# Patient Record
Sex: Male | Born: 1995 | Race: White | Hispanic: No | Marital: Single | Smoking: Never smoker
Health system: Southern US, Community
[De-identification: ages and names within clinical notes are randomized; demographics above are authoritative.]

## PROBLEM LIST (undated history)

## (undated) DIAGNOSIS — J45909 Unspecified asthma, uncomplicated: Secondary | ICD-10-CM

## (undated) DIAGNOSIS — F32A Depression, unspecified: Secondary | ICD-10-CM

## (undated) DIAGNOSIS — F419 Anxiety disorder, unspecified: Secondary | ICD-10-CM

## (undated) DIAGNOSIS — D649 Anemia, unspecified: Secondary | ICD-10-CM

## (undated) DIAGNOSIS — N200 Calculus of kidney: Secondary | ICD-10-CM

## (undated) DIAGNOSIS — Z87442 Personal history of urinary calculi: Secondary | ICD-10-CM

## (undated) DIAGNOSIS — T7840XA Allergy, unspecified, initial encounter: Secondary | ICD-10-CM

## (undated) DIAGNOSIS — K219 Gastro-esophageal reflux disease without esophagitis: Secondary | ICD-10-CM

## (undated) DIAGNOSIS — K5190 Ulcerative colitis, unspecified, without complications: Secondary | ICD-10-CM

## (undated) HISTORY — DX: Anemia, unspecified: D64.9

## (undated) HISTORY — DX: Anxiety disorder, unspecified: F41.9

## (undated) HISTORY — DX: Allergy, unspecified, initial encounter: T78.40XA

## (undated) HISTORY — PX: TONSILLECTOMY: SUR1361

## (undated) HISTORY — PX: COLONOSCOPY: SHX174

## (undated) HISTORY — DX: Depression, unspecified: F32.A

## (undated) HISTORY — DX: Gastro-esophageal reflux disease without esophagitis: K21.9

---
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
## 2019-01-05 ENCOUNTER — Other Ambulatory Visit: Payer: Self-pay

## 2019-01-05 DIAGNOSIS — Z20822 Contact with and (suspected) exposure to covid-19: Secondary | ICD-10-CM

## 2019-01-06 LAB — NOVEL CORONAVIRUS, NAA: SARS-CoV-2, NAA: NOT DETECTED

## 2019-03-11 ENCOUNTER — Encounter (HOSPITAL_COMMUNITY): Payer: Self-pay

## 2019-03-11 ENCOUNTER — Other Ambulatory Visit: Payer: Self-pay

## 2019-03-11 ENCOUNTER — Emergency Department (HOSPITAL_COMMUNITY)
Admission: EM | Admit: 2019-03-11 | Discharge: 2019-03-11 | Disposition: A | Discharge status: Home / Self Care | Payer: Self-pay | Attending: Emergency Medicine | Admitting: Emergency Medicine

## 2019-03-11 ENCOUNTER — Emergency Department (HOSPITAL_COMMUNITY): Payer: Self-pay

## 2019-03-11 DIAGNOSIS — Z79899 Other long term (current) drug therapy: Secondary | ICD-10-CM | POA: Insufficient documentation

## 2019-03-11 DIAGNOSIS — K921 Melena: Secondary | ICD-10-CM | POA: Insufficient documentation

## 2019-03-11 DIAGNOSIS — J45909 Unspecified asthma, uncomplicated: Secondary | ICD-10-CM | POA: Insufficient documentation

## 2019-03-11 DIAGNOSIS — R1030 Lower abdominal pain, unspecified: Secondary | ICD-10-CM | POA: Insufficient documentation

## 2019-03-11 DIAGNOSIS — D649 Anemia, unspecified: Secondary | ICD-10-CM | POA: Insufficient documentation

## 2019-03-11 DIAGNOSIS — R935 Abnormal findings on diagnostic imaging of other abdominal regions, including retroperitoneum: Secondary | ICD-10-CM | POA: Insufficient documentation

## 2019-03-11 DIAGNOSIS — R21 Rash and other nonspecific skin eruption: Secondary | ICD-10-CM | POA: Insufficient documentation

## 2019-03-11 LAB — CBC WITH DIFFERENTIAL/PLATELET
Abs Immature Granulocytes: 0.04 10*3/uL (ref 0.00–0.07)
Basophils Absolute: 0.1 10*3/uL (ref 0.0–0.1)
Basophils Relative: 1 %
Eosinophils Absolute: 0.7 10*3/uL — ABNORMAL HIGH (ref 0.0–0.5)
Eosinophils Relative: 5 %
HCT: 27.1 % — ABNORMAL LOW (ref 39.0–52.0)
Hemoglobin: 7.5 g/dL — ABNORMAL LOW (ref 13.0–17.0)
Immature Granulocytes: 0 %
Lymphocytes Relative: 13 %
Lymphs Abs: 2 10*3/uL (ref 0.7–4.0)
MCH: 19.6 pg — ABNORMAL LOW (ref 26.0–34.0)
MCHC: 27.7 g/dL — ABNORMAL LOW (ref 30.0–36.0)
MCV: 70.9 fL — ABNORMAL LOW (ref 80.0–100.0)
Monocytes Absolute: 1 10*3/uL (ref 0.1–1.0)
Monocytes Relative: 6 %
Neutro Abs: 11.2 10*3/uL — ABNORMAL HIGH (ref 1.7–7.7)
Neutrophils Relative %: 75 %
Platelets: 588 10*3/uL — ABNORMAL HIGH (ref 150–400)
RBC: 3.82 MIL/uL — ABNORMAL LOW (ref 4.22–5.81)
RDW: 15.8 % — ABNORMAL HIGH (ref 11.5–15.5)
WBC: 15 10*3/uL — ABNORMAL HIGH (ref 4.0–10.5)
nRBC: 0 % (ref 0.0–0.2)

## 2019-03-11 LAB — FOLATE: Folate: 11.4 ng/mL (ref >5.9)

## 2019-03-11 LAB — IRON AND TIBC
Iron: 10 ug/dL — ABNORMAL LOW (ref 45–182)
Saturation Ratios: 2 % — ABNORMAL LOW (ref 17.9–39.5)
TIBC: 558 ug/dL — ABNORMAL HIGH (ref 250–450)
UIBC: 548 ug/dL

## 2019-03-11 LAB — POC OCCULT BLOOD, ED: Fecal Occult Bld: NEGATIVE

## 2019-03-11 LAB — COMPREHENSIVE METABOLIC PANEL
ALT: 33 U/L (ref 0–44)
AST: 24 U/L (ref 15–41)
Albumin: 3.7 g/dL (ref 3.5–5.0)
Alkaline Phosphatase: 92 U/L (ref 38–126)
Anion gap: 10 (ref 5–15)
BUN: 12 mg/dL (ref 6–20)
CO2: 24 mmol/L (ref 22–32)
Calcium: 9 mg/dL (ref 8.9–10.3)
Chloride: 102 mmol/L (ref 98–111)
Creatinine, Ser: 0.95 mg/dL (ref 0.61–1.24)
GFR calc Af Amer: >60 mL/min (ref >60)
GFR calc non Af Amer: >60 mL/min (ref >60)
Glucose, Bld: 91 mg/dL (ref 70–99)
Potassium: 4.1 mmol/L (ref 3.5–5.1)
Sodium: 136 mmol/L (ref 135–145)
Total Bilirubin: 0.4 mg/dL (ref 0.3–1.2)
Total Protein: 8 g/dL (ref 6.5–8.1)

## 2019-03-11 LAB — RETICULOCYTES
Immature Retic Fract: 34.1 % — ABNORMAL HIGH (ref 2.3–15.9)
RBC.: 3.82 MIL/uL — ABNORMAL LOW (ref 4.22–5.81)
Retic Count, Absolute: 83.7 10*3/uL (ref 19.0–186.0)
Retic Ct Pct: 2.2 % (ref 0.4–3.1)

## 2019-03-11 LAB — TYPE AND SCREEN
ABO/RH(D): O POS
Antibody Screen: NEGATIVE

## 2019-03-11 LAB — FERRITIN: Ferritin: 9 ng/mL — ABNORMAL LOW (ref 24–336)

## 2019-03-11 LAB — SEDIMENTATION RATE: Sed Rate: 35 mm/hr — ABNORMAL HIGH (ref 0–16)

## 2019-03-11 LAB — VITAMIN B12: Vitamin B-12: 200 pg/mL (ref 180–914)

## 2019-03-11 MED ORDER — IOHEXOL 350 MG/ML SOLN
100.0000 mL | Freq: Once | INTRAVENOUS | Status: AC | PRN
  Administered 2019-03-11: 100 mL via INTRAVENOUS

## 2019-03-11 MED ORDER — FERROUS SULFATE 325 (65 FE) MG PO TABS: 325.0000 mg | ORAL_TABLET | Freq: Every day | ORAL | 0 refills | Status: DC

## 2019-03-11 NOTE — Discharge Instructions (Signed)
Please follow-up with the hematologist as well as the gastroenterologist provided.  They have been briefed regarding your evaluation and work-up and will be expecting your follow-up.  Please take your iron supplements, as prescribed.  Please return to the ED or seek medical attention immediately for any new or worsening symptoms.

## 2019-03-11 NOTE — ED Triage Notes (Signed)
Pt sent from [facility].  NP says he saw pt yesterday.  Reports pt has lesions around waste and groin, blood in stool, and  pain around umbilicus.  Reports hgb 7.9 and wbc 14.  Pt says has been having these symptoms for the past 10 months or more.

## 2019-03-11 NOTE — ED Provider Notes (Addendum)
[facility] EMERGENCY DEPARTMENT Provider Note   CSN: [id number] Arrival date & time: 03/11/19  4827     History Chief Complaint  Patient presents with  . GI Bleeding    Gregory Clarke is a 23 y.o. male with PMH significant for kidney stones and asthma who was sent from the ED from [facility] after patient reported a 26-monthhistory of loose bloody stools and groin lesions and was found to have hemoglobin of 7.9 and white blood cell count of 14.  Patient reports that he notices BRBPR intermittently with defecation.  He reports that he has been feeling progressively more fatigued and low energy these past couple months and has been seeing increasingly bloody bowel movements.  He also endorses lesions in the groin area that became as firm lumps before subsequently eroding and leaving scarring.  He is sexually active endorses unprotected sex, with his long-term girlfriend.  He denies any fevers or chills, chest pain, nausea or vomiting, headache or dizziness, hematuria, history of hemorrhoids, penile discharge, testicular swelling or discomfort, recent illness, or any other symptoms.  He reports that his mother's fianc is a family medicine provider, but that he did not want to see anyone despite chronicity of illness due to insurance concerns.  HPI     Past Medical History:  Diagnosis Date  . Asthma   . Kidney stones     Patient Active Problem List   Diagnosis Date Noted  . Foot fracture 09/09/2014    Past Surgical History:  Procedure Laterality Date  . TONSILLECTOMY         No family history on file.  Social History   Tobacco Use  . Smoking status: Never Smoker  . Smokeless tobacco: Never Used  Substance Use Topics  . Alcohol use: No  . Drug use: Yes    Types: Marijuana    Home Medications Prior to Admission medications   Medication Sig Start Date End Date Taking? Authorizing Provider  albuterol (VENTOLIN HFA) 108 (90 Base) MCG/ACT inhaler  Inhale 2 puffs into the lungs every 6 (six) hours as needed. 02/27/19  Yes [provider]  cetirizine (ZYRTEC) 10 MG tablet Take 10 mg by mouth daily.   Yes [provider]  tamsulosin (FLOMAX) 0.4 MG CAPS capsule Take 1 capsule (0.4 mg total) by mouth daily. Patient taking differently: Take 0.4 mg by mouth daily as needed.  12/07/16  Yes Triplett, Tammy, PA-C  ferrous sulfate 325 (65 FE) MG tablet Take 1 tablet (325 mg total) by mouth daily. 03/11/19   GCorena Herter PA-C    Allergies    Patient has no known allergies.  Review of Systems   Review of Systems  Constitutional: Negative for chills and fever.  Gastrointestinal: Positive for abdominal pain and blood in stool.  Genitourinary: Positive for genital sores. Negative for discharge and testicular pain.  Skin: Positive for rash.    Physical Exam Updated Vital Signs BP 104/86   Pulse 96   Temp 98.4 F (36.9 C) (Oral)   Resp 20   Ht 6' 2"  (1.88 m)   Wt 111.1 kg   SpO2 99%   BMI 31.46 kg/m   Physical Exam Vitals and nursing note reviewed. Exam conducted with a chaperone present.  Constitutional:      Appearance: Normal appearance.  HENT:     Head: Normocephalic and atraumatic.  Eyes:     General: No scleral icterus.    Conjunctiva/sclera: Conjunctivae normal.  Cardiovascular:  Rate and Rhythm: Normal rate and regular rhythm.     Pulses: Normal pulses.     Heart sounds: Normal heart sounds.  Pulmonary:     Effort: Pulmonary effort is normal. No respiratory distress.     Breath sounds: Normal breath sounds. No wheezing or rales.  Abdominal:     Comments: Soft, nondistended.  No significant TTP.  Mild TTP over suprapubic region.  No overlying skin changes.  No guarding.  Genitourinary:    Comments: No fissures or hemorrhoids noted on physical exam.  No BRBPR.  No melena.  No masses appreciated.  No testicular swelling or tenderness.  No penile lesions.  Diffuse rash in the area of  groin. Musculoskeletal:     Cervical back: Normal range of motion and neck supple. No rigidity.  Skin:    General: Skin is dry.  Neurological:     Mental Status: He is alert and oriented to person, place, and time.     GCS: GCS eye subscore is 4. GCS verbal subscore is 5. GCS motor subscore is 6.  Psychiatric:        Mood and Affect: Mood normal.        Behavior: Behavior normal.        Thought Content: Thought content normal.           ED Results / Procedures / Treatments   Labs (all labs ordered are listed, but only abnormal results are displayed) Labs Reviewed  CBC WITH DIFFERENTIAL/PLATELET - Abnormal; Notable for the following components:      Result Value   WBC 15.0 (*)    RBC 3.82 (*)    Hemoglobin 7.5 (*)    HCT 27.1 (*)    MCV 70.9 (*)    MCH 19.6 (*)    MCHC 27.7 (*)    RDW 15.8 (*)    Platelets 588 (*)    Neutro Abs 11.2 (*)    Eosinophils Absolute 0.7 (*)    All other components within normal limits  SEDIMENTATION RATE - Abnormal; Notable for the following components:   Sed Rate 35 (*)    All other components within normal limits  COMPREHENSIVE METABOLIC PANEL  URINALYSIS, ROUTINE W REFLEX MICROSCOPIC  VITAMIN B12  FOLATE  IRON AND TIBC  FERRITIN  RETICULOCYTES  POC OCCULT BLOOD, ED  TYPE AND SCREEN    EKG None  Radiology CT Angio Abd/Pel W and/or Wo Contrast  Result Date: 03/11/2019 CLINICAL DATA:  23 year old male with a history of blood in the stool and periumbilical pain EXAM: CTA ABDOMEN AND PELVIS WITHOUT AND WITH CONTRAST TECHNIQUE: Multidetector CT imaging of the abdomen and pelvis was performed using the standard protocol during bolus administration of intravenous contrast. Multiplanar reconstructed images and MIPs were obtained and reviewed to evaluate the vascular anatomy. CONTRAST:  174m OMNIPAQUE IOHEXOL 350 MG/ML SOLN COMPARISON:  None. FINDINGS: VASCULAR Aorta: Unremarkable course, caliber, contour of the abdominal aorta. No  dissection, aneurysm, or periaortic fluid. Celiac: Patent, with no significant atherosclerotic changes. SMA: Patent, with no significant atherosclerotic changes. Renals: Renal arteries are patent. IMA: Inferior mesenteric artery is patent. Right lower extremity: Unremarkable course, caliber, and contour of the right iliac system. No aneurysm, dissection, or occlusion. Hypogastric artery is patent. Common femoral artery patent. Proximal SFA and profunda femoris patent. Left lower extremity: Unremarkable course, caliber, and contour of the left iliac system. No aneurysm, dissection, or occlusion. Hypogastric artery is patent. Common femoral artery patent. Proximal SFA and profunda femoris patent. Veins:  Unremarkable appearance of the venous system. Review of the MIP images confirms the above findings. NON-VASCULAR Lower chest: No acute. Hepatobiliary: Unremarkable appearance of the liver. Unremarkable gall bladder. Pancreas: Unremarkable. Spleen: Unremarkable. Adrenals/Urinary Tract: Unremarkable appearance of adrenal glands. Right: No hydronephrosis. Symmetric perfusion to the left. Interval resolution of mild hydronephrosis when compared to the prior CT. There has been interval passage of the previous distal right ureteral stone. No inflammatory changes. Left: No hydronephrosis. Symmetric perfusion to the right. Nonobstructive nephrolithiasis of the left collecting system with single stone in the lateral collecting system. There appears to have been interval passage of the inferior collecting system stone. Unremarkable left ureter. Unremarkable appearance of the urinary bladder . Stomach/Bowel: Unremarkable appearance of the stomach. Unremarkable appearance of small bowel. No evidence of obstruction. Normal appendix. Unremarkable appearance of the colon. Lymphatic: Compared to the prior CT, there is a similar distribution and number of lymph nodes throughout the mesentery of both small bowel and colon. The most  prominent lymph nodes are near the splenic flexure, and within the mid abdomen, having increased in size in the interval. Largest measured node in the left upper quadrant measures 12 mm on image 18 of series 4. There appear to be nodes within the mesentery of the colon from the cecum through the sigmoid colon. In addition there are nodes in the perirectal fat and within the lateral pelvis such as the left-sided node on image 79 of series 4, also enlarging in the interval. Mesenteric: No free air or inflammatory changes. Reproductive: Unremarkable appearance of the pelvic organs. Other: No hernia. Musculoskeletal: No evidence of acute fracture. No bony canal narrowing. No significant degenerative changes of the hips. IMPRESSION: No acute arterial abnormality, with no CT evidence gastrointestinal hemorrhage. The CT demonstrates mesenteric adenopathy, with nodal pattern that is suspicious by both number and size, as there has been interval enlargement since the prior CT scan 12/07/2016. The differential includes myeloproliferative disorder/lymphoma, and referral for hematologic evaluation is recommended. Interval resolution of the right ureteral stone on prior CT, with persisting left-sided nonobstructive nephrolithiasis. Electronically Signed   By: Corrie Mckusick D.O.   On: 03/11/2019 16:34    Procedures Procedures (including critical care time)  Medications Ordered in ED Medications  iohexol (OMNIPAQUE) 350 MG/ML injection 100 mL (100 mLs Intravenous Contrast Given 03/11/19 1540)    ED Course  I have reviewed the triage vital signs and the nursing notes.  Pertinent labs & imaging results that were available during my care of the patient were reviewed by me and considered in my medical decision making (see chart for details).    MDM Rules/Calculators/A&P                      Given his history of illness, involving 28-monthhistory of loose stools with intermittent hematochezia, suspect possible  inflammatory bowel disease.  Patient's report of his groin rash involving firm nodules that then subsequently erode and scar suspicious for erythema nodosum, an extra gastrointestinal manifestation of inflammatory bowel disease.  Will obtain CT imaging of abdomen and pelvis with contrast to assess for abscess, toxic megacolon, obstruction, or other possible complications.  While patient has a leukocytosis to 15.0 and is anemic with hemoglobin 7.5, his CMP is reassuring demonstrates no electrolyte abnormalities.  Patient is in no acute distress and is hemodynamically stable.  My physical exam was reassuring and there is no BRBPR or evidence of melena, hemorrhoids, or fissures and fecal occult obtained was negative for  acute bleed.  Patient denies any nausea and is tolerating food and liquid without difficulty. Sed rate was also obtained and came back elevated to 35, further evidence to support inflammatory bowel disease.    CT abdomen and pelvis obtained with contrast demonstrates mesenteric adenopathy with nodal pattern suspicious for myeloproliferative disorder or lymphoma.  We will consult with gastroenterology as well as hematology.  Given his normal vital signs and chronicity of illness, discussed with Dr. Rogene Houston and we feel patient is appropriate for discharge with outpatient gastroenterology and hematology follow-up.    Dr. Tera Helper consulted and will see patient in office for ongoing evaluation and management.  Agrees that this is more likely an IBD picture, but will at least correct iron deficiency anemia with infusions.  Gastroenterology was consulted and patient requires iron studies and B12, but will see patient in 1 week for an appointment.  He will also require a colonoscopy.  Strict return precautions discussed with patient.  All of the evaluation and work-up results were discussed with the patient and any family at bedside. They were provided opportunity to ask any additional questions and  have none at this time. They have expressed understanding of verbal discharge instructions as well as return precautions and are agreeable to the plan.    Final Clinical Impression(s) / ED Diagnoses Final diagnoses:  Anemia, unspecified type    Rx / DC Orders ED Discharge Orders         Ordered    ferrous sulfate 325 (65 FE) MG tablet  Daily     03/11/19 1718           Corena Herter, PA-C 03/11/19 1718    Corena Herter, PA-C 03/11/19 1718    Noemi Chapel, MD 03/11/19 [phone]

## 2019-03-11 NOTE — ED Provider Notes (Signed)
This patient is a 23 year old male, presents pale in the conjunctive in the nailbeds, has had some symptoms over time including some hematochezia and was found today to be anemic with a low hemoglobin.  On exam the patient has a minimally tender abdomen, he is well-appearing with normal vital signs, no edema but pale as described above.  I have reviewed all of his abnormal labs with him, I personally spoke with Dr. Laural Golden of the gastroenterology service will follow up with him for colonoscopy next week and oncology was consulted and made recommendations as well.  The patient is aware that his CT scan and labs suggest the need for close follow-up, he is agreeable to this, he is also aware that this may be cancer and he will need to follow-up with the oncologist for that reason.  At this time the patient is stable for discharge and answered all questions.  Medical screening examination/treatment/procedure(s) were conducted as a shared visit with non-physician practitioner(s) and myself.  I personally evaluated the patient during the encounter.  Clinical Impression:   Final diagnoses:  Anemia, unspecified type  Abnormal CT of the abdomen         Noemi Chapel, MD 03/11/19 1750

## 2019-03-11 NOTE — ED Notes (Signed)
Pt given urinal.

## 2019-03-16 ENCOUNTER — Encounter (INDEPENDENT_AMBULATORY_CARE_PROVIDER_SITE_OTHER): Payer: Self-pay | Admitting: Internal Medicine

## 2019-03-16 ENCOUNTER — Other Ambulatory Visit (INDEPENDENT_AMBULATORY_CARE_PROVIDER_SITE_OTHER): Payer: Self-pay | Admitting: *Deleted

## 2019-03-16 ENCOUNTER — Other Ambulatory Visit: Payer: Self-pay

## 2019-03-16 ENCOUNTER — Ambulatory Visit (INDEPENDENT_AMBULATORY_CARE_PROVIDER_SITE_OTHER): Payer: Self-pay | Admitting: Internal Medicine

## 2019-03-16 DIAGNOSIS — R197 Diarrhea, unspecified: Secondary | ICD-10-CM

## 2019-03-16 DIAGNOSIS — D500 Iron deficiency anemia secondary to blood loss (chronic): Secondary | ICD-10-CM

## 2019-03-16 DIAGNOSIS — D509 Iron deficiency anemia, unspecified: Secondary | ICD-10-CM | POA: Insufficient documentation

## 2019-03-16 MED ORDER — DICYCLOMINE HCL 10 MG PO CAPS: 10.0000 mg | ORAL_CAPSULE | Freq: Three times a day (TID) | ORAL | 1 refills | Status: DC | PRN

## 2019-03-16 NOTE — Patient Instructions (Addendum)
Colonoscopy to be scheduled. Please stop iron at least 5 days before colonoscopy. Will check CBC at the time of colonoscopy. You can Tylenol up to 2 g/day on as-needed basis but no OTC NSAIDs as discussed.

## 2019-03-16 NOTE — Progress Notes (Signed)
Presenting complaint  Bloody diarrhea and iron deficiency anemia.  History of present illness  Patient is 24 year old who is referred through courtesy of Dr. Noemi Chapel of [facility] emergency room where he was seen on 03/11/2019.  Patient reports 1 year history of rectal bleeding.  To begin with he would notice small amount of blood with his bowel movements and it would occur every now and then.  Stool consistency change about 6 months ago when he began to have diarrhea.  On his worst days he would have as many as 4-5 stools per day.  With change in stool consistency he also noted increased amount of blood that he would pass with his bowel movements.  He also has been experiencing frequent nocturnal bowel movement but has not had any accidents.  For the last 1 month he has been experiencing sharp intermittent mid abdominal pain.  This pain is usually followed by a bowel movement but pain is not always relieved.  He has maintained his appetite despite the symptoms and he has not lost any weight.  He states he started probiotic 4 days ago and feels his stool is firming up.  However he is passing blood like before. Patient states he was seen by Rosealee Albee, PA at [facility] on 03/11/2019.  He had blood work.  His hemoglobin was low and he was advised to come to emergency room. Evaluation in emergency room revealed him to have H&H of 7.5 and 27.1 and MCV was low at 70.9.  Comprehensive chemistry panel was normal.  B12 and folate levels were normal but serum iron iron saturation and ferritin levels were normal.  His stool was noted to be heme positive. While in emergency room he also had abdominal pelvic CT which revealed small stone in left kidney but no abnormality noted to small or large bowel. I was contacted by Dr. Sabra Heck to arrange visit as soon as possible.  He felt patient was stable for outpatient work-up.  He was begun on iron pill which she has taken every day. He noted a rash to his  left elbow about 2 months ago.  He had been using topical hydrocortisone and rash resolved but rash came back 2 couple of days ago. Patient rarely takes Excedrin for headache.  He usually takes Tylenol.  He denies nausea vomiting heartburn fever chills or night sweats. Review of the systems is positive for history of kidney stones.  He states he has passed 8 to 10 stones in the last few years.  Stones have been analyzed but he does not remember the results.   Current Medications: Outpatient Encounter Medications as of 03/16/2019  Medication Sig  . albuterol (VENTOLIN HFA) 108 (90 Base) MCG/ACT inhaler Inhale 2 puffs into the lungs every 6 (six) hours as needed.  . ferrous sulfate 325 (65 FE) MG tablet Take 1 tablet (325 mg total) by mouth daily.  . tamsulosin (FLOMAX) 0.4 MG CAPS capsule Take 1 capsule (0.4 mg total) by mouth daily. (Patient taking differently: Take 0.4 mg by mouth daily as needed. )  . [DISCONTINUED] cetirizine (ZYRTEC) 10 MG tablet Take 10 mg by mouth daily.   No facility-administered encounter medications on file as of 03/16/2019.   Past medical history  History of kidney stones.  He has never required any intervention.  He has passed multiple stones over the last few years. Tonsillectomy at age 34. Mild obesity with BMI of 31.24% History of bronchial asthma.  He is not taking any  medications at the present time  Allergies  NKA  Family history  Both parents are in their late 54s and in good health. He has 1 biologic brother in good health. He has 2 half brothers and one half sister also in good health.   Social history  Patient is single.  He is never married.  He graduated from high school in 2017.  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  He is presently unemployed.  He does not smoke cigarettes or drink alcohol.  Physical examination  Blood pressure  (!) 148/84, pulse (!) 116, temperature (!) 97.2 F (36.2 C), temperature source Oral, height 6' 2"  (1.88 m), weight 243 lb 4.8 oz (110.4 kg). Patient is alert and in no acute distress. Conjunctiva is pale. Sclera is nonicteric Oropharyngeal mucosa is normal. No neck masses or thyromegaly noted. Cardiac exam with regular rhythm normal S1 and S2. No murmur or gallop noted. Lungs are clear to auscultation. Abdomen is symmetrical.  Bowel sounds are normal.  On palpation abdomen is soft.  He has mild periumbilical tenderness.  No organomegaly or masses. Rectal examination deferred. No LE edema or clubbing noted. He has multiple small tattoos around his left and right wrists. He has maculopapular erythematous rash involving ulnar aspect of left elbow and proximal forearm.  Labs/studies Results:  CBC Latest Ref Rng & Units 03/11/2019  WBC 4.0 - 10.5 K/uL 15.0(H)  Hemoglobin 13.0 - 17.0 g/dL 7.5(L)  Hematocrit 39.0 - 52.0 % 27.1(L)  Platelets 150 - 400 K/uL 588(H)    CMP Latest Ref Rng & Units 03/11/2019  Glucose 70 - 99 mg/dL 91  BUN 6 - 20 mg/dL 12  Creatinine 0.61 - 1.24 mg/dL 0.95  Sodium 135 - 145 mmol/L 136  Potassium 3.5 - 5.1 mmol/L 4.1  Chloride 98 - 111 mmol/L 102  CO2 22 - 32 mmol/L 24  Calcium 8.9 - 10.3 mg/dL 9.0  Total Protein 6.5 - 8.1 g/dL 8.0  Total Bilirubin 0.3 - 1.2 mg/dL 0.4  Alkaline Phos 38 - 126 U/L 92  AST 15 - 41 U/L 24  ALT 0 - 44 U/L 33    Hepatic Function Latest Ref Rng & Units 03/11/2019  Total Protein 6.5 - 8.1 g/dL 8.0  Albumin 3.5 - 5.0 g/dL 3.7  AST 15 - 41 U/L 24  ALT 0 - 44 U/L 33  Alk Phosphatase 38 - 126 U/L 92  Total Bilirubin 0.3 - 1.2 mg/dL 0.4     Sed rate 35 Serum folate 11.4 Serum B12 200 Serum iron 10, TIBC 558 and saturation 2% Serum ferritin 9.  CTA abdomen and pelvis from 03/11/2019 reviewed. No wall thickening noted to small or large bowel.  There is mesenteric adenopathy more prominent since prior study of 12/07/2016.   Small stone in left kidney.  Assessment:  Patient is 24 year old Caucasian male who presents with bloody diarrhea and iron deficiency anemia.  His symptoms began 1 year ago with sporadic hematochezia followed by diarrhea about 6 months ago and he is now having mid abdominal pain.  Recent CT showed mesenteric adenopathy but no wall thickening to small or large bowel.  Patient's presentation is very worrisome for inflammatory bowel disease.  Colonic lymphoma is also in differential diagnosis but he does not have any constitutional symptoms.  He would benefit from diagnostic colonoscopy as soon as possible.  Recommendations  Dicyclomine  10 mg by mouth 3 times a day as needed for abdominal pain. Diagnostic colonoscopy under monitored anesthesia care as soon as possible. Patient advised to hold p.o. iron for at least 5 to 7 days prior to the procedure.

## 2019-03-16 NOTE — H&P (View-Only) (Signed)
Presenting complaint  Bloody diarrhea and iron deficiency anemia.  History of present illness  Patient is 24 year old who is referred through courtesy of Dr. Noemi Chapel of [facility] emergency room where he was seen on 03/11/2019.  Patient reports 1 year history of rectal bleeding.  To begin with he would notice small amount of blood with his bowel movements and it would occur every now and then.  Stool consistency change about 6 months ago when he began to have diarrhea.  On his worst days he would have as many as 4-5 stools per day.  With change in stool consistency he also noted increased amount of blood that he would pass with his bowel movements.  He also has been experiencing frequent nocturnal bowel movement but has not had any accidents.  For the last 1 month he has been experiencing sharp intermittent mid abdominal pain.  This pain is usually followed by a bowel movement but pain is not always relieved.  He has maintained his appetite despite the symptoms and he has not lost any weight.  He states he started probiotic 4 days ago and feels his stool is firming up.  However he is passing blood like before. Patient states he was seen by Rosealee Albee, PA at [facility] on 03/11/2019.  He had blood work.  His hemoglobin was low and he was advised to come to emergency room. Evaluation in emergency room revealed him to have H&H of 7.5 and 27.1 and MCV was low at 70.9.  Comprehensive chemistry panel was normal.  B12 and folate levels were normal but serum iron iron saturation and ferritin levels were normal.  His stool was noted to be heme positive. While in emergency room he also had abdominal pelvic CT which revealed small stone in left kidney but no abnormality noted to small or large bowel. I was contacted by Dr. Sabra Heck to arrange visit as soon as possible.  He felt patient was stable for outpatient work-up.  He was begun on iron pill which she has taken every day. He noted a rash to his  left elbow about 2 months ago.  He had been using topical hydrocortisone and rash resolved but rash came back 2 couple of days ago. Patient rarely takes Excedrin for headache.  He usually takes Tylenol.  He denies nausea vomiting heartburn fever chills or night sweats. Review of the systems is positive for history of kidney stones.  He states he has passed 8 to 10 stones in the last few years.  Stones have been analyzed but he does not remember the results.   Current Medications: Outpatient Encounter Medications as of 03/16/2019  Medication Sig  . albuterol (VENTOLIN HFA) 108 (90 Base) MCG/ACT inhaler Inhale 2 puffs into the lungs every 6 (six) hours as needed.  . ferrous sulfate 325 (65 FE) MG tablet Take 1 tablet (325 mg total) by mouth daily.  . tamsulosin (FLOMAX) 0.4 MG CAPS capsule Take 1 capsule (0.4 mg total) by mouth daily. (Patient taking differently: Take 0.4 mg by mouth daily as needed. )  . [DISCONTINUED] cetirizine (ZYRTEC) 10 MG tablet Take 10 mg by mouth daily.   No facility-administered encounter medications on file as of 03/16/2019.   Past medical history  History of kidney stones.  He has never required any intervention.  He has passed multiple stones over the last few years. Tonsillectomy at age 65. Mild obesity with BMI of 31.24% History of bronchial asthma.  He is not taking any  medications at the present time  Allergies  NKA  Family history  Both parents are in their late 36s and in good health. He has 1 biologic brother in good health. He has 2 half brothers and one half sister also in good health.   Social history  Patient is single.  He is never married.  He graduated from high school in 2017.  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  He is presently unemployed.  He does not smoke cigarettes or drink alcohol.  Physical examination  Blood pressure  (!) 148/84, pulse (!) 116, temperature (!) 97.2 F (36.2 C), temperature source Oral, height 6' 2"  (1.88 m), weight 243 lb 4.8 oz (110.4 kg). Patient is alert and in no acute distress. Conjunctiva is pale. Sclera is nonicteric Oropharyngeal mucosa is normal. No neck masses or thyromegaly noted. Cardiac exam with regular rhythm normal S1 and S2. No murmur or gallop noted. Lungs are clear to auscultation. Abdomen is symmetrical.  Bowel sounds are normal.  On palpation abdomen is soft.  He has mild periumbilical tenderness.  No organomegaly or masses. Rectal examination deferred. No LE edema or clubbing noted. He has multiple small tattoos around his left and right wrists. He has maculopapular erythematous rash involving ulnar aspect of left elbow and proximal forearm.  Labs/studies Results:  CBC Latest Ref Rng & Units 03/11/2019  WBC 4.0 - 10.5 K/uL 15.0(H)  Hemoglobin 13.0 - 17.0 g/dL 7.5(L)  Hematocrit 39.0 - 52.0 % 27.1(L)  Platelets 150 - 400 K/uL 588(H)    CMP Latest Ref Rng & Units 03/11/2019  Glucose 70 - 99 mg/dL 91  BUN 6 - 20 mg/dL 12  Creatinine 0.61 - 1.24 mg/dL 0.95  Sodium 135 - 145 mmol/L 136  Potassium 3.5 - 5.1 mmol/L 4.1  Chloride 98 - 111 mmol/L 102  CO2 22 - 32 mmol/L 24  Calcium 8.9 - 10.3 mg/dL 9.0  Total Protein 6.5 - 8.1 g/dL 8.0  Total Bilirubin 0.3 - 1.2 mg/dL 0.4  Alkaline Phos 38 - 126 U/L 92  AST 15 - 41 U/L 24  ALT 0 - 44 U/L 33    Hepatic Function Latest Ref Rng & Units 03/11/2019  Total Protein 6.5 - 8.1 g/dL 8.0  Albumin 3.5 - 5.0 g/dL 3.7  AST 15 - 41 U/L 24  ALT 0 - 44 U/L 33  Alk Phosphatase 38 - 126 U/L 92  Total Bilirubin 0.3 - 1.2 mg/dL 0.4     Sed rate 35 Serum folate 11.4 Serum B12 200 Serum iron 10, TIBC 558 and saturation 2% Serum ferritin 9.  CTA abdomen and pelvis from 03/11/2019 reviewed. No wall thickening noted to small or large bowel.  There is mesenteric adenopathy more prominent since prior study of 12/07/2016.   Small stone in left kidney.  Assessment:  Patient is 24 year old Caucasian male who presents with bloody diarrhea and iron deficiency anemia.  His symptoms began 1 year ago with sporadic hematochezia followed by diarrhea about 6 months ago and he is now having mid abdominal pain.  Recent CT showed mesenteric adenopathy but no wall thickening to small or large bowel.  Patient's presentation is very worrisome for inflammatory bowel disease.  Colonic lymphoma is also in differential diagnosis but he does not have any constitutional symptoms.  He would benefit from diagnostic colonoscopy as soon as possible.  Recommendations  Dicyclomine  10 mg by mouth 3 times a day as needed for abdominal pain. Diagnostic colonoscopy under monitored anesthesia care as soon as possible. Patient advised to hold p.o. iron for at least 5 to 7 days prior to the procedure.

## 2019-03-17 ENCOUNTER — Other Ambulatory Visit (INDEPENDENT_AMBULATORY_CARE_PROVIDER_SITE_OTHER): Payer: Self-pay | Admitting: *Deleted

## 2019-03-17 ENCOUNTER — Encounter (INDEPENDENT_AMBULATORY_CARE_PROVIDER_SITE_OTHER): Payer: Self-pay | Admitting: *Deleted

## 2019-03-17 ENCOUNTER — Encounter (INDEPENDENT_AMBULATORY_CARE_PROVIDER_SITE_OTHER): Payer: Self-pay

## 2019-03-17 DIAGNOSIS — D500 Iron deficiency anemia secondary to blood loss (chronic): Secondary | ICD-10-CM | POA: Insufficient documentation

## 2019-03-19 ENCOUNTER — Encounter (HOSPITAL_COMMUNITY): Payer: Self-pay | Admitting: Hematology

## 2019-03-19 ENCOUNTER — Other Ambulatory Visit: Payer: Self-pay

## 2019-03-19 ENCOUNTER — Inpatient Hospital Stay (HOSPITAL_COMMUNITY): Payer: Self-pay | Attending: Hematology | Admitting: Hematology

## 2019-03-19 ENCOUNTER — Inpatient Hospital Stay (HOSPITAL_COMMUNITY): Payer: Self-pay

## 2019-03-19 ENCOUNTER — Encounter (HOSPITAL_COMMUNITY): Payer: Self-pay | Admitting: *Deleted

## 2019-03-19 VITALS — BP 119/72 | HR 113 | Temp 97.8°F | Resp 18 | Ht 73.5 in | Wt 243.8 lb

## 2019-03-19 DIAGNOSIS — R591 Generalized enlarged lymph nodes: Secondary | ICD-10-CM | POA: Insufficient documentation

## 2019-03-19 DIAGNOSIS — R197 Diarrhea, unspecified: Secondary | ICD-10-CM

## 2019-03-19 DIAGNOSIS — R109 Unspecified abdominal pain: Secondary | ICD-10-CM | POA: Insufficient documentation

## 2019-03-19 DIAGNOSIS — R590 Localized enlarged lymph nodes: Secondary | ICD-10-CM | POA: Insufficient documentation

## 2019-03-19 DIAGNOSIS — N200 Calculus of kidney: Secondary | ICD-10-CM

## 2019-03-19 DIAGNOSIS — K50911 Crohn's disease, unspecified, with rectal bleeding: Secondary | ICD-10-CM | POA: Insufficient documentation

## 2019-03-19 DIAGNOSIS — D509 Iron deficiency anemia, unspecified: Secondary | ICD-10-CM

## 2019-03-19 DIAGNOSIS — D500 Iron deficiency anemia secondary to blood loss (chronic): Secondary | ICD-10-CM | POA: Insufficient documentation

## 2019-03-19 DIAGNOSIS — K625 Hemorrhage of anus and rectum: Secondary | ICD-10-CM | POA: Insufficient documentation

## 2019-03-19 DIAGNOSIS — R0602 Shortness of breath: Secondary | ICD-10-CM | POA: Insufficient documentation

## 2019-03-19 DIAGNOSIS — G629 Polyneuropathy, unspecified: Secondary | ICD-10-CM | POA: Insufficient documentation

## 2019-03-19 DIAGNOSIS — R21 Rash and other nonspecific skin eruption: Secondary | ICD-10-CM | POA: Insufficient documentation

## 2019-03-19 DIAGNOSIS — Z87442 Personal history of urinary calculi: Secondary | ICD-10-CM | POA: Insufficient documentation

## 2019-03-19 DIAGNOSIS — J45909 Unspecified asthma, uncomplicated: Secondary | ICD-10-CM | POA: Insufficient documentation

## 2019-03-19 HISTORY — DX: Calculus of kidney: N20.0

## 2019-03-19 LAB — VITAMIN D 25 HYDROXY (VIT D DEFICIENCY, FRACTURES): Vit D, 25-Hydroxy: 15.98 ng/mL — ABNORMAL LOW (ref 30–100)

## 2019-03-19 LAB — C-REACTIVE PROTEIN: CRP: 0.7 mg/dL (ref <1.0)

## 2019-03-19 MED ORDER — CYANOCOBALAMIN 1000 MCG/ML IJ SOLN
INTRAMUSCULAR | Status: AC
  Filled 2019-03-19: qty 1

## 2019-03-19 MED ORDER — CYANOCOBALAMIN 1000 MCG/ML IJ SOLN
1000.0000 ug | Freq: Once | INTRAMUSCULAR | Status: AC
  Administered 2019-03-19: 1000 ug via INTRAMUSCULAR

## 2019-03-19 NOTE — Progress Notes (Signed)
[facility] Initial Visit:  Patient Care Team: [facility] as PCP - General  CHIEF COMPLAINTS/PURPOSE OF CONSULTATION: - Anemia and lymphadenopathy    HISTORY OF PRESENTING ILLNESS: Gregory Clarke 24 y.o. male presents today for consult regarding anemia and lymphadenopathy noted on CT scan.  He has a past medical history significant for asthma and kidney stones.  He was recently seen by primary care provider and was found to have a hemoglobin of 7.9 and white blood cell count of 14.  He was recommended to go to emergency room for possible blood transfusion.  Labs in the emergency room on 03/11/2019 revealed a hemoglobin of 7.5, MCV 70.9,   Elevated white blood cell count 15 with elevated neutrophils 11.2, platelet count 588K.  Chemistry panel was normal.  Ferritin low at 9, iron 10, TIBC 588, saturation 2%, vitamin B12 200.   He reports over the past year he has been experiencing bright red blood per rectum.  He states he has several bowel movements throughout the day and later in the day he will start to experience rectal bleeding.  Amount of blood has increased over the past 3 months.  He also has developed a sharp stomach pain throughout his abdomen.  The pain lasts for a few seconds.  The pain is intermittent.  He states the pain occurs more often after meals.  CT of the abdomen and pelvis completed on 03/11/2019 revealed lymph nodes throughout the mesentery of both small bowel and colon.  The most prominent lymph nodes are near the splenic flexure and within the mid abdomen.  The largest node in the left upper quadrant measures 12 mm.  Also of note, nodules seen within the mesentery of the colon from the cecum through the sigmoid colon.  He has also developed red tender nodules under his skin primarily in his groin area.  These nodules erupt spontaneously.  Today, he describes a neuropathy sensation in  bilateral lower extremities. He states that  this has been present for the last week.  He denies a family history of cancer.  Denies family history of gastrointestinal disease.  He is not a smoker.  States he does not drink alcohol.  He smokes marijuana occasionally.  He is currently unemployed.  Denies any fevers, chills, night sweats.  Reports lightheadedness and dizziness and shortness of breath on exertion, that resolves with rest.   Review of Systems  Constitutional: Positive for fatigue.  HENT:  Negative.   Eyes: Negative.   Respiratory: Positive for shortness of breath.   Cardiovascular: Negative.   Gastrointestinal: Positive for abdominal pain and diarrhea.  Endocrine: Negative.   Genitourinary: Negative.    Musculoskeletal: Negative.   Skin: Negative.   Neurological: Negative.   Hematological: Negative.   Psychiatric/Behavioral: Negative.     MEDICAL HISTORY: Past Medical History:  Diagnosis Date  . Anemia   . Asthma   . Kidney stones 03/19/2019    SURGICAL HISTORY: Past Surgical History:  Procedure Laterality Date  . TONSILLECTOMY      SOCIAL HISTORY: Social History   Socioeconomic History  . Marital status: Single    Spouse name: Not on file  . Number of children: 0  . Years of education: Not on file  . Highest education level: Not on file  Occupational History  . Occupation: umemployed  Tobacco Use  . Smoking status: Never Smoker  . Smokeless tobacco: Never Used  Substance and Sexual Activity  . Alcohol use: No  .  Drug use: Yes    Types: Marijuana  . Sexual activity: Yes  Other Topics Concern  . Not on file  Social History Narrative  . Not on file   Social Determinants of Health   Financial Resource Strain:   . Difficulty of Paying Living Expenses: Not on file  Food Insecurity:   . Worried About Charity fundraiser in the Last Year: Not on file  . Ran Out of Food in the Last Year: Not on file  Transportation Needs:   . Lack of Transportation (Medical): Not on file  . Lack of  Transportation (Non-Medical): Not on file  Physical Activity:   . Days of Exercise per Week: Not on file  . Minutes of Exercise per Session: Not on file  Stress:   . Feeling of Stress : Not on file  Social Connections:   . Frequency of Communication with Friends and Family: Not on file  . Frequency of Social Gatherings with Friends and Family: Not on file  . Attends Religious Services: Not on file  . Active Member of Clubs or Organizations: Not on file  . Attends Archivist Meetings: Not on file  . Marital Status: Not on file  Intimate Partner Violence:   . Fear of Current or Ex-Partner: Not on file  . Emotionally Abused: Not on file  . Physically Abused: Not on file  . Sexually Abused: Not on file    FAMILY HISTORY Family History  Problem Relation Age of Onset  . Healthy Mother   . Healthy Father   . Healthy Sister   . Healthy Brother     ALLERGIES:  has No Known Allergies.  MEDICATIONS:  Current Outpatient Medications  Medication Sig Dispense Refill  . dicyclomine (BENTYL) 10 MG capsule Take 1 capsule (10 mg total) by mouth 3 (three) times daily as needed for spasms. 60 capsule 1  . ferrous sulfate 325 (65 FE) MG tablet Take 1 tablet (325 mg total) by mouth daily. 30 tablet 0  . albuterol (VENTOLIN HFA) 108 (90 Base) MCG/ACT inhaler Inhale 2 puffs into the lungs every 6 (six) hours as needed.    . tamsulosin (FLOMAX) 0.4 MG CAPS capsule Take 1 capsule (0.4 mg total) by mouth daily. (Patient not taking: Reported on 03/19/2019) 20 capsule 0   No current facility-administered medications for this visit.    PHYSICAL EXAMINATION:  ECOG PERFORMANCE STATUS: 1 - Symptomatic but completely ambulatory   Vitals:   03/19/19 0821  BP: 119/72  Pulse: (!) 113  Resp: 18  Temp: 97.8 F (36.6 C)  SpO2: 99%    Filed Weights   03/19/19 0821  Weight: 243 lb 12.8 oz (110.6 kg)     Physical Exam Constitutional:      Appearance: Normal appearance.  HENT:     Head:  Normocephalic.     Right Ear: External ear normal.     Left Ear: External ear normal.     Nose: Nose normal.     Mouth/Throat:     Pharynx: Oropharynx is clear.  Eyes:     Conjunctiva/sclera: Conjunctivae normal.  Cardiovascular:     Rate and Rhythm: Regular rhythm. Tachycardia present.     Pulses: Normal pulses.     Heart sounds: Normal heart sounds.  Pulmonary:     Effort: Pulmonary effort is normal.     Breath sounds: Normal breath sounds.  Abdominal:     General: Bowel sounds are normal.  Musculoskeletal:  General: Normal range of motion.     Cervical back: Normal range of motion.  Skin:    Comments: Multiple red and purple nodules under the skin in the groin, back, and neck.  Neurological:     General: No focal deficit present.     Mental Status: He is alert and oriented to person, place, and time.  Psychiatric:        Mood and Affect: Mood normal.        Behavior: Behavior normal.      LABORATORY DATA: I have personally reviewed the data as listed:  Appointment on 03/19/2019  Component Date Value Ref Range Status  . Vit D, 25-Hydroxy 03/19/2019 15.98* 30 - 100 ng/mL Final   Comment: (NOTE) Vitamin D deficiency has been defined by the [place] practice guideline as a level of serum 25-OH  vitamin D less than 20 ng/mL (1,2). The Endocrine Society went on to  further define vitamin D insufficiency as a level between 21 and 29  ng/mL (2). 1. IOM (Institute of Medicine). 2010. Dietary reference intakes for  calcium and D. [place]: The [employer]. 2. Holick MF, Binkley [place], Bischoff-Ferrari HA, et al. Evaluation,  treatment, and prevention of vitamin D deficiency: an Endocrine  Society clinical practice guideline, JCEM. 2011 Jul; 96(7): 1911-30. Performed at [facility], [place] [address], [place], [place] [address]   . CRP 03/19/2019 0.7  <1.0 mg/dL Final   Performed at [facility], [address], [place], [place] [address]  Admission on 03/11/2019, Discharged on 03/11/2019  Component Date Value Ref Range Status  . WBC 03/11/2019 15.0* 4.0 - 10.5 K/uL Final  . RBC 03/11/2019 3.82* 4.22 - 5.81 MIL/uL Final  . Hemoglobin 03/11/2019 7.5* 13.0 - 17.0 g/dL Final   Comment: Reticulocyte Hemoglobin testing may be clinically indicated, consider ordering this additional test [id number]   . HCT 03/11/2019 27.1* 39.0 - 52.0 % Final  . MCV 03/11/2019 70.9* 80.0 - 100.0 fL Final  . MCH 03/11/2019 19.6* 26.0 - 34.0 pg Final  . MCHC 03/11/2019 27.7* 30.0 - 36.0 g/dL Final  . RDW 03/11/2019 15.8* 11.5 - 15.5 % Final  . Platelets 03/11/2019 588* 150 - 400 K/uL Final  . nRBC 03/11/2019 0.0  0.0 - 0.2 % Final  . Neutrophils Relative % 03/11/2019 75  % Final  . Neutro Abs 03/11/2019 11.2* 1.7 - 7.7 K/uL Final  . Lymphocytes Relative 03/11/2019 13  % Final  . Lymphs Abs 03/11/2019 2.0  0.7 - 4.0 K/uL Final  . Monocytes Relative 03/11/2019 6  % Final  . Monocytes Absolute 03/11/2019 1.0  0.1 - 1.0 K/uL Final  . Eosinophils Relative 03/11/2019 5  % Final  . Eosinophils Absolute 03/11/2019 0.7* 0.0 - 0.5 K/uL Final  . Basophils Relative 03/11/2019 1  % Final  . Basophils Absolute 03/11/2019 0.1  0.0 - 0.1 K/uL Final  . Immature Granulocytes 03/11/2019 0  % Final  . Abs Immature Granulocytes 03/11/2019 0.04  0.00 - 0.07 K/uL Final   Performed at [facility], [address], [place], [place] [address]  . Sodium 03/11/2019 136  135 - 145 mmol/L Final  . Potassium 03/11/2019 4.1  3.5 - 5.1 mmol/L Final  . Chloride 03/11/2019 102  98 - 111 mmol/L Final  . CO2 03/11/2019 24  22 - 32 mmol/L Final  . Glucose, Bld 03/11/2019 91  70 - 99 mg/dL Final  . BUN 03/11/2019  12  6 - 20 mg/dL Final  . Creatinine, Ser 03/11/2019 0.95  0.61 - 1.24 mg/dL Final  . Calcium 03/11/2019 9.0  8.9 - 10.3 mg/dL Final  . Total Protein 03/11/2019 8.0  6.5 - 8.1 g/dL Final  . Albumin 03/11/2019 3.7  3.5 - 5.0 g/dL Final  .  AST 03/11/2019 24  15 - 41 U/L Final  . ALT 03/11/2019 33  0 - 44 U/L Final  . Alkaline Phosphatase 03/11/2019 92  38 - 126 U/L Final  . Total Bilirubin 03/11/2019 0.4  0.3 - 1.2 mg/dL Final  . GFR calc non Af Amer 03/11/2019 >60  >60 mL/min Final  . GFR calc Af Amer 03/11/2019 >60  >60 mL/min Final  . Anion gap 03/11/2019 10  5 - 15 Final   Performed at [facility], [address], [place], [place] [address]  . Fecal Occult Bld 03/11/2019 NEGATIVE  NEGATIVE Final  . ABO/RH(D) 03/11/2019 O POS   Final  . Antibody Screen 03/11/2019 NEG   Final  . Sample Expiration 03/11/2019    Final                   Value:03/14/2019,2359 Performed at [facility], [address], [place], [place] [address]   . Sed Rate 03/11/2019 35* 0 - 16 mm/hr Final   Performed at [facility], [address] [place], [place] [address]  . Vitamin B-12 03/11/2019 200  180 - 914 pg/mL Final   Comment: (NOTE) This assay is not validated for testing neonatal or myeloproliferative syndrome specimens for Vitamin B12 levels. Performed at [facility], [address], [place], [place] [address]   . Folate 03/11/2019 11.4  >5.9 ng/mL Final   Performed at [facility], [address], [place], [place] [address]  . Iron 03/11/2019 10* 45 - 182 ug/dL Final  . TIBC 03/11/2019 558* 250 - 450 ug/dL Final  . Saturation Ratios 03/11/2019 2* 17.9 - 39.5 % Final  . UIBC 03/11/2019 548  ug/dL Final   Performed at [facility], [address], [place], [place] [address]  . Ferritin 03/11/2019 9* 24 - 336 ng/mL Final   Performed at [facility], [address] [place], [place] [address]  . Retic Ct Pct 03/11/2019 2.2  0.4 - 3.1 % Final  . RBC. 03/11/2019 3.82* 4.22 - 5.81 MIL/uL Final  . Retic Count, Absolute 03/11/2019 83.7  19.0 - 186.0 K/uL Final  . Immature Retic Fract 03/11/2019 34.1* 2.3 - 15.9 % Final   Performed at [facility], [address] [place], [place] [address]     ASSESSMENT/PLAN   Iron deficiency anemia due to  chronic blood loss 1. Iron Deficiency Anemia -Presented with bright red blood per rectum, diarrhea, and abdominal pain x1 year. -Labs 03/11/2019: hemoglobin of 7.5, MCV 70.9,   Elevated white blood cell count 15 with elevated neutrophils 11.2, platelet count 588K.  Chemistry panel was normal.  Ferritin low at 9, iron 10, TIBC 588, saturation 2%, vitamin B12 200.  -CT Abdomen and pelvis: revealed lymph nodes throughout the mesentery of both small bowel and colon.  The most prominent lymph nodes are near the splenic flexure and within the mid abdomen. The largest node in the left upper quadrant measures 12 mm.  Also of note, nodules seen within the mesentery of the colon from the cecum through the sigmoid colon. -We discussed differential diagnosis of anemia.  In his case anemia is most likely secondary to chronic blood loss.  It is likely the patient has underlying Crohn's disease.  He also has associated Erythema nodosum, that is is a common manifestation of Crohn's disease.  Lymphadenopathy seen in the small bowel and colon is another common manifestation of Crohn's disease. -Patient has no B symptoms.  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  Have recommended vitamin B 12 injection in clinic today.  Have also recommended patient begin sublingual vitamin B12 daily. -Patient is short of breath with activity.  Hence, recommend patient proceed with IV Feraheme 510 mg x 2 doses 1 week apart for his iron deficiency.  Discussed side effects of IV Feraheme including but not limited to severe anaphylactic reaction. -We will also complete several lab studies on the patient today including C-reactive protein, vitamin D, methylmalonic acid, and tissue transglutaminase IgA antibody. -He is scheduled for colonoscopy on January 19, we will await those results. -He will return to clinic in 6 weeks for repeat labs and office visit.  Addendum: -I have  independently examined this patient and elicited history.  I agree with assessment and plan in the HPI written by my nurse practitioner Carver Fila, FNP.  He has intermittent diarrhea and hematochezia over the past 1 year, recently gotten worse.  He denies any joint pains.  He however reports rash in the groin area which starts out as cystic lesions and ruptures.  He also has some nodular erythematous rash in the scalp region and posterior neck.  He was found to have severe microcytic anemia with low iron levels and borderline B12 levels.  He will be given B12 injection today and he will start taking B12 tablet daily.  He reports that he is getting tired easily and unable to do day-to-day activities.  He started taking iron tablet about a week ago.  Because of his symptoms, I think he will benefit from parenteral iron therapy x2.  We talked about Feraheme and its side effects in detail including anaphylactic reactions.  We will schedule him for the next infusion. -CT scan in the ER showed mesenteric adenopathy involving the mesentery of both small and large bowel.  The largest lymph node measures about 1.2 cm.  This is likely reactive from underlying inflammatory bowel disease.      Orders Placed This Encounter  Procedures  . Methylmalonic acid, serum    Standing Status:   Future    Number of Occurrences:   1    Standing Expiration Date:   03/18/2020  . Vitamin D 25 hydroxy    Standing Status:   Future    Number of Occurrences:   1    Standing Expiration Date:   03/18/2020  . C-reactive protein    Standing Status:   Future    Number of Occurrences:   1    Standing Expiration Date:   03/18/2020  . Tissue Transglutaminase Abs,IgG,IgA    All questions were answered. The patient knows to call the clinic with any problems, questions or concerns.  This note was electronically signed.    Derek Jack, MD  03/19/2019 6:33 PM

## 2019-03-19 NOTE — Assessment & Plan Note (Addendum)
1. Iron Deficiency Anemia -Presented with bright red blood per rectum, diarrhea, and abdominal pain x1 year. -Labs 03/11/2019: hemoglobin of 7.5, MCV 70.9,   Elevated white blood cell count 15 with elevated neutrophils 11.2, platelet count 588K.  Chemistry panel was normal.  Ferritin low at 9, iron 10, TIBC 588, saturation 2%, vitamin B12 200.  -CT Abdomen and pelvis: revealed lymph nodes throughout the mesentery of both small bowel and colon.  The most prominent lymph nodes are near the splenic flexure and within the mid abdomen. The largest node in the left upper quadrant measures 12 mm.  Also of note, nodules seen within the mesentery of the colon from the cecum through the sigmoid colon. -We discussed differential diagnosis of anemia.  In his case anemia is most likely secondary to chronic blood loss.  It is likely the patient has underlying Crohn's disease.  He also has associated Erythema nodosum, that is is a common manifestation of Crohn's disease.  Lymphadenopathy seen in the small bowel and colon is another common manifestation of Crohn's disease. -Patient has no B symptoms.  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  Have recommended vitamin B 12 injection in clinic today.  Have also recommended patient begin sublingual vitamin B12 daily. -Patient is short of breath with activity.  Hence, recommend patient proceed with IV Feraheme 510 mg x 2 doses 1 week apart for his iron deficiency.  Discussed side effects of IV Feraheme including but not limited to severe anaphylactic reaction. -We will also complete several lab studies on the patient today including C-reactive protein, vitamin D, methylmalonic acid, and tissue transglutaminase IgA antibody. -He is scheduled for colonoscopy on January 19, we will await those results. -He will return to clinic in 6 weeks for repeat labs and office visit.  Addendum: -I have independently examined  this patient and elicited history.  I agree with assessment and plan in the HPI written by my nurse practitioner Carver Fila, FNP.  He has intermittent diarrhea and hematochezia over the past 1 year, recently gotten worse.  He denies any joint pains.  He however reports rash in the groin area which starts out as cystic lesions and ruptures.  He also has some nodular erythematous rash in the scalp region and posterior neck.  He was found to have severe microcytic anemia with low iron levels and borderline B12 levels.  He will be given B12 injection today and he will start taking B12 tablet daily.  He reports that he is getting tired easily and unable to do day-to-day activities.  He started taking iron tablet about a week ago.  Because of his symptoms, I think he will benefit from parenteral iron therapy x2.  We talked about Feraheme and its side effects in detail including anaphylactic reactions.  We will schedule him for the next infusion. -CT scan in the ER showed mesenteric adenopathy involving the mesentery of both small and large bowel.  The largest lymph node measures about 1.2 cm.  This is likely reactive from underlying inflammatory bowel disease.

## 2019-03-20 ENCOUNTER — Inpatient Hospital Stay (HOSPITAL_COMMUNITY): Payer: Self-pay

## 2019-03-20 ENCOUNTER — Other Ambulatory Visit (HOSPITAL_COMMUNITY): Payer: Self-pay | Admitting: *Deleted

## 2019-03-20 VITALS — BP 122/83 | HR 95 | Temp 97.7°F | Resp 16

## 2019-03-20 DIAGNOSIS — D500 Iron deficiency anemia secondary to blood loss (chronic): Secondary | ICD-10-CM

## 2019-03-20 MED ORDER — DIPHENHYDRAMINE HCL 25 MG PO CAPS
50.0000 mg | ORAL_CAPSULE | Freq: Once | ORAL | Status: AC
  Administered 2019-03-20: 50 mg via ORAL

## 2019-03-20 MED ORDER — SODIUM CHLORIDE 0.9 % IV SOLN: Freq: Once | INTRAVENOUS | Status: AC

## 2019-03-20 MED ORDER — FERAHEME 510 MG/17ML IV SOLN: 510.0000 mg | Freq: Once | INTRAVENOUS | 12 refills | Status: DC

## 2019-03-20 MED ORDER — ACETAMINOPHEN 325 MG PO TABS
650.0000 mg | ORAL_TABLET | Freq: Once | ORAL | Status: AC
  Administered 2019-03-20: 650 mg via ORAL

## 2019-03-20 MED ORDER — SODIUM CHLORIDE 0.9 % IV SOLN
510.0000 mg | Freq: Once | INTRAVENOUS | Status: AC
  Administered 2019-03-20: 510 mg via INTRAVENOUS
  Filled 2019-03-20: qty 510

## 2019-03-20 NOTE — Progress Notes (Signed)
Iron given per orders. Patient tolerated it well without problems. Vitals stable and discharged home from clinic ambulatory. Follow up as scheduled.

## 2019-03-20 NOTE — Addendum Note (Signed)
Addended by: Henreitta Leber E on: 03/20/2019 01:51 PM   Modules accepted: Orders

## 2019-03-23 LAB — METHYLMALONIC ACID, SERUM: Methylmalonic Acid, Quantitative: 131 nmol/L (ref 0–378)

## 2019-03-26 NOTE — Patient Instructions (Signed)
[facility]  03/26/2019     @PREFPERIOPPHARMACY @   Your procedure is scheduled on  03/31/2019.  Report to [facility] at  [phone]   A.M.  Call this number if you have problems the morning of surgery:  [phone]   Remember:  Follow the diet and prep instructions given to you by Dr [facility] office.                       Take these medicines the morning of surgery with A SIP OF WATER dicyclomine,flomax.    Do not wear jewelry, make-up or nail polish.  Do not wear lotions, powders, or perfumes. Please wear deodorant and brush your teeth.  Do not shave 48 hours prior to surgery.  Men may shave face and neck.  Do not bring valuables to the [facility].  [facility] is not responsible for any belongings or valuables.  Contacts, dentures or bridgework may not be worn into surgery.  Leave your suitcase in the car.  After surgery it may be brought to your room.  For patients admitted to the [facility], discharge time will be determined by your treatment team.  Patients discharged the day of surgery will not be allowed to drive home.   Name and phone number of your driver:   family Special instructions:  None  Please read over the following fact sheets that you were given. Anesthesia Post-op Instructions and Care and Recovery After Surgery       Colonoscopy, Adult, Care After This sheet gives you information about how to care for yourself after your procedure. Your health care provider may also give you more specific instructions. If you have problems or questions, contact your health care provider. What can I expect after the procedure? After the procedure, it is common to have:  A small amount of blood in your stool for 24 hours after the procedure.  Some gas.  Mild cramping or bloating of your abdomen. Follow these instructions at home: Eating and drinking   Drink enough fluid to keep your urine pale yellow.  Follow instructions from your health care provider  about eating or drinking restrictions.  Resume your normal diet as instructed by your health care provider. Avoid heavy or fried foods that are hard to digest. Activity  Rest as told by your health care provider.  Avoid sitting for a long time without moving. Get up to take short walks every 1-2 hours. This is important to improve blood flow and breathing. Ask for help if you feel weak or unsteady.  Return to your normal activities as told by your health care provider. Ask your health care provider what activities are safe for you. Managing cramping and bloating   Try walking around when you have cramps or feel bloated.  Apply heat to your abdomen as told by your health care provider. Use the heat source that your health care provider recommends, such as a moist heat pack or a heating pad. ? Place a towel between your skin and the heat source. ? Leave the heat on for 20-30 minutes. ? Remove the heat if your skin turns bright red. This is especially important if you are unable to feel pain, heat, or cold. You may have a greater risk of getting burned. General instructions  For the first 24 hours after the procedure: ? Do not drive or use machinery. ? Do not sign important documents. ? Do not drink alcohol. ?  Do your regular daily activities at a slower pace than normal. ? Eat soft foods that are easy to digest.  Take over-the-counter and prescription medicines only as told by your health care provider.  Keep all follow-up visits as told by your health care provider. This is important. Contact a health care provider if:  You have blood in your stool 2-3 days after the procedure. Get help right away if you have:  More than a small spotting of blood in your stool.  Large blood clots in your stool.  Swelling of your abdomen.  Nausea or vomiting.  A fever.  Increasing pain in your abdomen that is not relieved with medicine. Summary  After the procedure, it is common to have  a small amount of blood in your stool. You may also have mild cramping and bloating of your abdomen.  For the first 24 hours after the procedure, do not drive or use machinery, sign important documents, or drink alcohol.  Get help right away if you have a lot of blood in your stool, nausea or vomiting, a fever, or increased pain in your abdomen. This information is not intended to replace advice given to you by your health care provider. Make sure you discuss any questions you have with your health care provider. Document Revised: 09/22/2018 Document Reviewed: 09/22/2018 Elsevier Patient Education  [facility] After These instructions provide you with information about caring for yourself after your procedure. Your health care provider may also give you more specific instructions. Your treatment has been planned according to current medical practices, but problems sometimes occur. Call your health care provider if you have any problems or questions after your procedure. What can I expect after the procedure? After your procedure, you may:  Feel sleepy for several hours.  Feel clumsy and have poor balance for several hours.  Feel forgetful about what happened after the procedure.  Have poor judgment for several hours.  Feel nauseous or vomit.  Have a sore throat if you had a breathing tube during the procedure. Follow these instructions at home: For at least 24 hours after the procedure:      Have a responsible adult stay with you. It is important to have someone help care for you until you are awake and alert.  Rest as needed.  Do not: ? Participate in activities in which you could fall or become injured. ? Drive. ? Use heavy machinery. ? Drink alcohol. ? Take sleeping pills or medicines that cause drowsiness. ? Make important decisions or sign legal documents. ? Take care of children on your own. Eating and drinking  Follow the diet  that is recommended by your health care provider.  If you vomit, drink water, juice, or soup when you can drink without vomiting.  Make sure you have little or no nausea before eating solid foods. General instructions  Take over-the-counter and prescription medicines only as told by your health care provider.  If you have sleep apnea, surgery and certain medicines can increase your risk for breathing problems. Follow instructions from your health care provider about wearing your sleep device: ? Anytime you are sleeping, including during daytime naps. ? While taking prescription pain medicines, sleeping medicines, or medicines that make you drowsy.  If you smoke, do not smoke without supervision.  Keep all follow-up visits as told by your health care provider. This is important. Contact a health care provider if:  You keep feeling nauseous or you keep  vomiting.  You feel light-headed.  You develop a rash.  You have a fever. Get help right away if:  You have trouble breathing. Summary  For several hours after your procedure, you may feel sleepy and have poor judgment.  Have a responsible adult stay with you for at least 24 hours or until you are awake and alert. This information is not intended to replace advice given to you by your health care provider. Make sure you discuss any questions you have with your health care provider. Document Revised: 05/27/2017 Document Reviewed: 06/19/2015 Elsevier Patient Education  [place].

## 2019-03-27 ENCOUNTER — Encounter (HOSPITAL_COMMUNITY)
Admission: RE | Admit: 2019-03-27 | Discharge: 2019-03-27 | Disposition: A | Discharge status: Home / Self Care | Payer: Self-pay | Source: Ambulatory Visit | Attending: Internal Medicine | Admitting: Internal Medicine

## 2019-03-27 ENCOUNTER — Other Ambulatory Visit (HOSPITAL_COMMUNITY)
Admission: RE | Admit: 2019-03-27 | Discharge: 2019-03-27 | Disposition: A | Discharge status: Home / Self Care | Payer: HRSA Program | Source: Ambulatory Visit | Attending: Internal Medicine | Admitting: Internal Medicine

## 2019-03-27 ENCOUNTER — Encounter (HOSPITAL_COMMUNITY): Payer: Self-pay

## 2019-03-27 ENCOUNTER — Encounter: Payer: Self-pay | Admitting: Pharmacy Technician

## 2019-03-27 ENCOUNTER — Other Ambulatory Visit: Payer: Self-pay

## 2019-03-27 DIAGNOSIS — Z01812 Encounter for preprocedural laboratory examination: Secondary | ICD-10-CM | POA: Insufficient documentation

## 2019-03-27 DIAGNOSIS — Z20822 Contact with and (suspected) exposure to covid-19: Secondary | ICD-10-CM | POA: Diagnosis not present

## 2019-03-27 HISTORY — DX: Personal history of urinary calculi: Z87.442

## 2019-03-27 LAB — CBC WITH DIFFERENTIAL/PLATELET
Abs Immature Granulocytes: 0.02 10*3/uL (ref 0.00–0.07)
Basophils Absolute: 0.1 10*3/uL (ref 0.0–0.1)
Basophils Relative: 1 %
Eosinophils Absolute: 0.4 10*3/uL (ref 0.0–0.5)
Eosinophils Relative: 5 %
HCT: 33.6 % — ABNORMAL LOW (ref 39.0–52.0)
Hemoglobin: 9.5 g/dL — ABNORMAL LOW (ref 13.0–17.0)
Immature Granulocytes: 0 %
Lymphocytes Relative: 18 %
Lymphs Abs: 1.5 10*3/uL (ref 0.7–4.0)
MCH: 22.7 pg — ABNORMAL LOW (ref 26.0–34.0)
MCHC: 28.3 g/dL — ABNORMAL LOW (ref 30.0–36.0)
MCV: 80.2 fL (ref 80.0–100.0)
Monocytes Absolute: 0.8 10*3/uL (ref 0.1–1.0)
Monocytes Relative: 9 %
Neutro Abs: 5.9 10*3/uL (ref 1.7–7.7)
Neutrophils Relative %: 67 %
Platelets: 471 10*3/uL — ABNORMAL HIGH (ref 150–400)
RBC: 4.19 MIL/uL — ABNORMAL LOW (ref 4.22–5.81)
RDW: 27.5 % — ABNORMAL HIGH (ref 11.5–15.5)
WBC: 8.7 10*3/uL (ref 4.0–10.5)
nRBC: 0 % (ref 0.0–0.2)

## 2019-03-27 LAB — SARS CORONAVIRUS 2 (TAT 6-24 HRS): SARS Coronavirus 2: NEGATIVE

## 2019-03-27 NOTE — Progress Notes (Signed)
Patient has been approved for drug assistance by [employer] for Feraheme. The enrollment period is from 03/20/19-03/18/20 based on self pay. First DOS covered is 03/20/19.

## 2019-03-31 ENCOUNTER — Encounter (HOSPITAL_COMMUNITY): Payer: Self-pay | Admitting: Internal Medicine

## 2019-03-31 ENCOUNTER — Ambulatory Visit (HOSPITAL_COMMUNITY): Payer: Self-pay | Admitting: Anesthesiology

## 2019-03-31 ENCOUNTER — Ambulatory Visit (HOSPITAL_COMMUNITY)
Admission: RE | Admit: 2019-03-31 | Discharge: 2019-03-31 | Disposition: A | Discharge status: Home / Self Care | Payer: Self-pay | Attending: Internal Medicine | Admitting: Internal Medicine

## 2019-03-31 ENCOUNTER — Encounter (HOSPITAL_COMMUNITY)
Admission: RE | Disposition: A | Discharge status: Home / Self Care | Payer: Self-pay | Source: Home / Self Care | Attending: Internal Medicine

## 2019-03-31 DIAGNOSIS — K5100 Ulcerative (chronic) pancolitis without complications: Secondary | ICD-10-CM | POA: Insufficient documentation

## 2019-03-31 DIAGNOSIS — R197 Diarrhea, unspecified: Secondary | ICD-10-CM | POA: Insufficient documentation

## 2019-03-31 DIAGNOSIS — K529 Noninfective gastroenteritis and colitis, unspecified: Secondary | ICD-10-CM

## 2019-03-31 DIAGNOSIS — D500 Iron deficiency anemia secondary to blood loss (chronic): Secondary | ICD-10-CM | POA: Insufficient documentation

## 2019-03-31 DIAGNOSIS — K51011 Ulcerative (chronic) pancolitis with rectal bleeding: Secondary | ICD-10-CM

## 2019-03-31 DIAGNOSIS — J45909 Unspecified asthma, uncomplicated: Secondary | ICD-10-CM | POA: Insufficient documentation

## 2019-03-31 DIAGNOSIS — E669 Obesity, unspecified: Secondary | ICD-10-CM | POA: Insufficient documentation

## 2019-03-31 DIAGNOSIS — Z6831 Body mass index (BMI) 31.0-31.9, adult: Secondary | ICD-10-CM | POA: Insufficient documentation

## 2019-03-31 HISTORY — PX: COLONOSCOPY WITH PROPOFOL: SHX5780

## 2019-03-31 HISTORY — PX: BIOPSY: SHX5522

## 2019-03-31 LAB — GASTROINTESTINAL PANEL BY PCR, STOOL (REPLACES STOOL CULTURE)

## 2019-03-31 LAB — C DIFFICILE QUICK SCREEN W PCR REFLEX
C Diff antigen: NEGATIVE
C Diff interpretation: NOT DETECTED
C Diff toxin: NEGATIVE

## 2019-03-31 LAB — HEPATITIS B SURFACE ANTIGEN: Hepatitis B Surface Ag: NONREACTIVE

## 2019-03-31 LAB — LACTOFERRIN, FECAL, QUALITATIVE: Lactoferrin, Fecal, Qual: POSITIVE — AB

## 2019-03-31 LAB — HEPATITIS B SURFACE ANTIBODY,QUALITATIVE: Hep B S Ab: REACTIVE — AB

## 2019-03-31 LAB — HEPATITIS C ANTIBODY: HCV Ab: NONREACTIVE

## 2019-03-31 SURGERY — COLONOSCOPY WITH PROPOFOL
Anesthesia: General

## 2019-03-31 MED ORDER — VITAMIN D 125 MCG (5000 UT) PO CAPS: 5000.0000 [IU]/d | ORAL_CAPSULE | Freq: Every day | ORAL | Status: DC

## 2019-03-31 MED ORDER — MIDAZOLAM HCL 5 MG/5ML IJ SOLN
INTRAMUSCULAR | Status: DC | PRN
  Administered 2019-03-31: 2 mg via INTRAVENOUS

## 2019-03-31 MED ORDER — PROPOFOL 500 MG/50ML IV EMUL
INTRAVENOUS | Status: DC | PRN
  Administered 2019-03-31: 150 ug/kg/min via INTRAVENOUS

## 2019-03-31 MED ORDER — LIDOCAINE HCL (PF) 0.5 % IJ SOLN
INTRAMUSCULAR | Status: AC
  Filled 2019-03-31: qty 50

## 2019-03-31 MED ORDER — PHENYLEPHRINE 40 MCG/ML (10ML) SYRINGE FOR IV PUSH (FOR BLOOD PRESSURE SUPPORT)
PREFILLED_SYRINGE | INTRAVENOUS | Status: AC
  Filled 2019-03-31: qty 10

## 2019-03-31 MED ORDER — MIDAZOLAM HCL 2 MG/2ML IJ SOLN
INTRAMUSCULAR | Status: AC
  Filled 2019-03-31: qty 2

## 2019-03-31 MED ORDER — PROPOFOL 10 MG/ML IV BOLUS
INTRAVENOUS | Status: AC
  Filled 2019-03-31: qty 20

## 2019-03-31 MED ORDER — GLYCOPYRROLATE PF 0.2 MG/ML IJ SOSY
PREFILLED_SYRINGE | INTRAMUSCULAR | Status: AC
  Filled 2019-03-31: qty 1

## 2019-03-31 MED ORDER — CHLORHEXIDINE GLUCONATE CLOTH 2 % EX PADS: 6.0000 | MEDICATED_PAD | Freq: Once | CUTANEOUS | Status: DC

## 2019-03-31 MED ORDER — LACTATED RINGERS IV SOLN: Freq: Once | INTRAVENOUS | Status: AC

## 2019-03-31 MED ORDER — PHENYLEPHRINE HCL (PRESSORS) 10 MG/ML IV SOLN
INTRAVENOUS | Status: DC | PRN
  Administered 2019-03-31 (×2): 80 ug via INTRAVENOUS
  Administered 2019-03-31: 60 ug via INTRAVENOUS

## 2019-03-31 MED ORDER — PROPOFOL 10 MG/ML IV BOLUS
INTRAVENOUS | Status: DC | PRN
  Administered 2019-03-31 (×2): 20 mg via INTRAVENOUS
  Administered 2019-03-31: 40 mg via INTRAVENOUS
  Administered 2019-03-31 (×7): 20 mg via INTRAVENOUS

## 2019-03-31 MED ORDER — STERILE WATER FOR IRRIGATION IR SOLN
Status: DC | PRN
  Administered 2019-03-31: 1.5 mL

## 2019-03-31 MED ORDER — KETAMINE HCL 50 MG/5ML IJ SOSY
PREFILLED_SYRINGE | INTRAMUSCULAR | Status: AC
  Filled 2019-03-31: qty 5

## 2019-03-31 MED ORDER — KETAMINE HCL 10 MG/ML IJ SOLN
INTRAMUSCULAR | Status: DC | PRN
  Administered 2019-03-31: 10 mg via INTRAVENOUS
  Administered 2019-03-31: 20 mg via INTRAVENOUS

## 2019-03-31 NOTE — Anesthesia Postprocedure Evaluation (Signed)
Anesthesia Post Note  Patient: [facility]  Procedure(s) Performed: COLONOSCOPY WITH PROPOFOL (N/A ) BIOPSY  Patient location during evaluation: PACU Anesthesia Type: General Level of consciousness: awake and alert and oriented Pain management: pain level controlled Vital Signs Assessment: post-procedure vital signs reviewed and stable Respiratory status: spontaneous breathing Cardiovascular status: blood pressure returned to baseline and stable Postop Assessment: no apparent nausea or vomiting Anesthetic complications: no     Last Vitals:  Vitals:   03/31/19 0708  BP: 128/88  Pulse: 100  Resp: 20  Temp: 36.9 C  SpO2: 98%    Last Pain:  Vitals:   03/31/19 0737  TempSrc:   PainSc: 0-No pain                 Ugonna Keirsey

## 2019-03-31 NOTE — Interval H&P Note (Signed)
Since patient was last seen he feels better.  Abdominal pain has decreased.  He does not feel as weak.  He is still seeing some blood with his bowel movements. He had a hemoglobin on 03/27/2019 is gone up to 9.5.  Review of lab studies revealed low vitamin D. Covid test is negative. Physical examination has not changed except he does not have abdominal tenderness. Patient is agreeable to proceed with diagnostic colonoscopy.  History and Physical Interval Note:  03/31/2019 7:22 AM  [facility]  has presented today for surgery, with the diagnosis of IDA due to Chronic GI Bleeding ,Bloody Diarrhea.  The various methods of treatment have been discussed with the patient and family. After consideration of risks, benefits and other options for treatment, the patient has consented to  Procedure(s) with comments: COLONOSCOPY WITH PROPOFOL (N/A) - 7:30 as a surgical intervention.  The patient's history has been reviewed, patient examined, no change in status, stable for surgery.  I have reviewed the patient's chart and labs.  Questions were answered to the patient's satisfaction.     [employer]

## 2019-03-31 NOTE — Discharge Instructions (Signed)
Colonoscopy, Adult, Care After This sheet gives you information about how to care for yourself after your procedure. Your doctor may also give you more specific instructions. If you have problems or questions, call your doctor. What can I expect after the procedure? After the procedure, it is common to have:  A small amount of blood in your poop (stool) for 24 hours.  Some gas.  Mild cramping or bloating in your belly (abdomen). Follow these instructions at home: Eating and drinking   Drink enough fluid to keep your pee (urine) pale yellow.  Follow instructions from your doctor about what you cannot eat or drink.  Return to your normal diet as told by your doctor. Avoid heavy or fried foods that are hard to digest. Activity  Rest as told by your doctor.  Do not sit for a long time without moving. Get up to take short walks every 1-2 hours. This is important. Ask for help if you feel weak or unsteady.  Return to your normal activities as told by your doctor. Ask your doctor what activities are safe for you. To help cramping and bloating:   Try walking around.  Put heat on your belly as told by your doctor. Use the heat source that your doctor recommends, such as a moist heat pack or a heating pad. ? Put a towel between your skin and the heat source. ? Leave the heat on for 20-30 minutes. ? Remove the heat if your skin turns bright red. This is very important if you are unable to feel pain, heat, or cold. You may have a greater risk of getting burned. General instructions  For the first 24 hours after the procedure: ? Do not drive or use machinery. ? Do not sign important documents. ? Do not drink alcohol. ? Do your daily activities more slowly than normal. ? Eat foods that are soft and easy to digest.  Take over-the-counter or prescription medicines only as told by your doctor.  Keep all follow-up visits as told by your doctor. This is important. Contact a doctor  if:  You have blood in your poop 2-3 days after the procedure. Get help right away if:  You have more than a small amount of blood in your poop.  You see large clumps of tissue (blood clots) in your poop.  Your belly is swollen.  You feel like you may vomit (nauseous).  You vomit.  You have a fever.  You have belly pain that gets worse, and medicine does not help your pain. Summary  After the procedure, it is common to have a small amount of blood in your poop. You may also have mild cramping and bloating in your belly.  For the first 24 hours after the procedure, do not drive or use machinery, do not sign important documents, and do not drink alcohol.  Get help right away if you have a lot of blood in your poop, feel like you may vomit, have a fever, or have more belly pain. This information is not intended to replace advice given to you by your health care provider. Make sure you discuss any questions you have with your health care provider. Document Revised: 09/22/2018 Document Reviewed: 09/22/2018 Elsevier Patient Education  [place]. No aspirin or NSAIDs. Resume usual medications as before. Vitamin D 5000 units by mouth daily. Resume usual diet. Physician will call with results of stool test biopsy and blood test and further recommendations.

## 2019-03-31 NOTE — Transfer of Care (Signed)
Immediate Anesthesia Transfer of Care Note  Patient: [facility]  Procedure(s) Performed: COLONOSCOPY WITH PROPOFOL (N/A ) BIOPSY  Patient Location: PACU  Anesthesia Type:General  Level of Consciousness: awake  Airway & Oxygen Therapy: Patient Spontanous Breathing  Post-op Assessment: Report given to RN  Post vital signs: Reviewed  Last Vitals:  Vitals Value Taken Time  BP 113/72 03/31/19 0815  Temp    Pulse 94 03/31/19 0817  Resp 21 03/31/19 0817  SpO2 99 % 03/31/19 0817  Vitals shown include unvalidated device data.  Last Pain:  Vitals:   03/31/19 0737  TempSrc:   PainSc: 0-No pain      Patients Stated Pain Goal: 5 (07/57/32 2567)  Complications: No apparent anesthesia complications

## 2019-03-31 NOTE — Op Note (Signed)
[facility] Patient Name: [facility] Procedure Date: 03/31/2019 7:15 AM MRN: [id number] Date of Birth: November 06, 1995 Attending MD: Hildred Laser , MD CSN: [id number] Age: 24 Admit Type: Outpatient Procedure:                Colonoscopy Indications:              Chronic diarrhea, Iron deficiency anemia secondary                            to chronic blood loss Providers:                Hildred Laser, MD, Charlsie Quest. Theda Sers RN, RN,                            Randa Spike, Technician Referring MD:              Medicines:                Propofol per Anesthesia Complications:            No immediate complications. Estimated Blood Loss:     Estimated blood loss was minimal. Procedure:                Pre-Anesthesia Assessment:                           - Prior to the procedure, a History and Physical                            was performed, and patient medications and                            allergies were reviewed. The patient's tolerance of                            previous anesthesia was also reviewed. The risks                            and benefits of the procedure and the sedation                            options and risks were discussed with the patient.                            All questions were answered, and informed consent                            was obtained. Prior Anticoagulants: The patient has                            taken no previous anticoagulant or antiplatelet                            agents. ASA Grade Assessment: II - A patient with  mild systemic disease. After reviewing the risks                            and benefits, the patient was deemed in                            satisfactory condition to undergo the procedure.                           After obtaining informed consent, the colonoscope                            was passed under direct vision. Throughout the                            procedure, the  patient's blood pressure, pulse, and                            oxygen saturations were monitored continuously. The                            [id number]-[id number] ([id number]) scope was introduced through                            the anus and advanced to the the terminal ileum,                            with identification of the appendiceal orifice and                            IC valve. The colonoscopy was performed without                            difficulty. The patient tolerated the procedure                            well. The quality of the bowel preparation was                            good. The terminal ileum, ileocecal valve,                            appendiceal orifice, and rectum were photographed. Scope In: 7:39:21 AM Scope Out: 8:07:19 AM Scope Withdrawal Time: 0 hours 20 minutes 30 seconds  Total Procedure Duration: 0 hours 27 minutes 58 seconds  Findings:      The perianal and digital rectal examinations were normal. Pertinent       negatives include normal sphincter tone.      The terminal ileum appeared normal.      The ascending colon and cecum appeared normal. Biopsies were taken with       a cold forceps for histology. The pathology specimen was placed into       Bottle Number 2(transverse colon), left colon( bottle 4) and rectum       (bottle 5)  Inflammation was found in a continuous and circumferential pattern from       the rectum to the hepatic flexure. This was graded as Mayo Score 2       (moderate, with marked erythema, absent vascular pattern, friability,       erosions), and when compared to the previous examination, the findings       are new. Stool specimen taken for GI pathogen panel panel.      No additional abnormalities were found on retroflexion. Impression:               - The examined portion of the ileum was normal.                           - The ascending colon and cecum are normal.                            Biopsied.                            - Moderately active (Mayo Score 2) pancolitis                            ulcerative colitis, new diagnosis. Moderate Sedation:      Per Anesthesia Care Recommendation:           - Patient has a contact number available for                            emergencies. The signs and symptoms of potential                            delayed complications were discussed with the                            patient. Return to normal activities tomorrow.                            Written discharge instructions were provided to the                            patient.                           - Resume previous diet today.                           - Continue present medications.                           - No aspirin, ibuprofen, naproxen, or other                            non-steroidal anti-inflammatory drugs.                           - HBsAg, anti-Hbs and HCV antibody.                           -  Quinteferon TB gold plus.                           - Await pathology results.                           - No recommendation at this time regarding repeat                            colonoscopy at this time. Procedure Code(s):        --- Professional ---                           [phone], Colonoscopy, flexible; with biopsy, single                            or multiple Diagnosis Code(s):        --- Professional ---                           K51.00, Ulcerative (chronic) pancolitis without                            complications                           K52.9, Noninfective gastroenteritis and colitis,                            unspecified                           D50.0, Iron deficiency anemia secondary to blood                            loss (chronic) CPT copyright 2019 American Medical Association. All rights reserved. The codes documented in this report are preliminary and upon coder review may  be revised to meet current compliance requirements. Hildred Laser, MD Hildred Laser, MD 03/31/2019  8:27:56 AM This report has been signed electronically. Number of Addenda: 0

## 2019-03-31 NOTE — Anesthesia Preprocedure Evaluation (Signed)
Anesthesia Evaluation  Patient identified by MRN, date of birth, ID band Patient awake    Reviewed: Allergy & Precautions, NPO status , Patient's Chart, lab work & pertinent test results  Airway Mallampati: II  TM Distance: >3 FB Neck ROM: Full    Dental no notable dental hx. (+) Teeth Intact   Pulmonary asthma ,    Pulmonary exam normal breath sounds clear to auscultation       Cardiovascular Exercise Tolerance: Good Normal cardiovascular exam Rhythm:Regular Rate:Normal     Neuro/Psych negative neurological ROS  negative psych ROS   GI/Hepatic negative GI ROS, Neg liver ROS,   Endo/Other  negative endocrine ROS  Renal/GU Renal disease  negative genitourinary   Musculoskeletal negative musculoskeletal ROS (+)   Abdominal   Peds  Hematology  (+) anemia ,   Anesthesia Other Findings   Reproductive/Obstetrics negative OB ROS                             Anesthesia Physical Anesthesia Plan  ASA: II  Anesthesia Plan: General   Post-op Pain Management:    Induction: Intravenous  PONV Risk Score and Plan: TIVA  Airway Management Planned: Nasal Cannula, Natural Airway and Simple Face Mask  Additional Equipment:   Intra-op Plan:   Post-operative Plan:   Informed Consent: I have reviewed the patients History and Physical, chart, labs and discussed the procedure including the risks, benefits and alternatives for the proposed anesthesia with the patient or authorized representative who has indicated his/her understanding and acceptance.     Dental advisory given  Plan Discussed with: CRNA and Surgeon  Anesthesia Plan Comments:         Anesthesia Quick Evaluation

## 2019-04-01 ENCOUNTER — Other Ambulatory Visit (INDEPENDENT_AMBULATORY_CARE_PROVIDER_SITE_OTHER): Payer: Self-pay | Admitting: Internal Medicine

## 2019-04-01 LAB — QUANTIFERON-TB GOLD PLUS (RQFGPL)
QuantiFERON Mitogen Value: >10 IU/mL
QuantiFERON Nil Value: 0.03 IU/mL
QuantiFERON TB1 Ag Value: 0.04 IU/mL
QuantiFERON TB2 Ag Value: 0.03 IU/mL

## 2019-04-01 LAB — QUANTIFERON-TB GOLD PLUS: QuantiFERON-TB Gold Plus: NEGATIVE

## 2019-04-01 MED ORDER — PREDNISONE 10 MG PO TABS: 30.0000 mg | ORAL_TABLET | Freq: Every day | ORAL | 0 refills | Status: DC

## 2019-04-02 LAB — SURGICAL PATHOLOGY

## 2019-04-02 LAB — CYTOLOGY - NON PAP

## 2019-04-02 LAB — O&P RESULT

## 2019-04-02 LAB — OVA + PARASITE EXAM

## 2019-04-03 ENCOUNTER — Other Ambulatory Visit: Payer: Self-pay

## 2019-04-03 ENCOUNTER — Inpatient Hospital Stay (HOSPITAL_COMMUNITY): Payer: Self-pay

## 2019-04-03 ENCOUNTER — Encounter (HOSPITAL_COMMUNITY): Payer: Self-pay

## 2019-04-03 VITALS — BP 111/68 | HR 84 | Temp 96.6°F | Resp 18 | Wt 237.8 lb

## 2019-04-03 DIAGNOSIS — D500 Iron deficiency anemia secondary to blood loss (chronic): Secondary | ICD-10-CM

## 2019-04-03 MED ORDER — SODIUM CHLORIDE 0.9 % IV SOLN: Freq: Once | INTRAVENOUS | Status: AC

## 2019-04-03 MED ORDER — SODIUM CHLORIDE 0.9 % IV SOLN
510.0000 mg | Freq: Once | INTRAVENOUS | Status: AC
  Administered 2019-04-03: 510 mg via INTRAVENOUS
  Filled 2019-04-03: qty 510

## 2019-04-03 MED ORDER — DIPHENHYDRAMINE HCL 25 MG PO CAPS
50.0000 mg | ORAL_CAPSULE | Freq: Once | ORAL | Status: AC
  Administered 2019-04-03: 12:00:00 50 mg via ORAL

## 2019-04-03 MED ORDER — ACETAMINOPHEN 325 MG PO TABS
ORAL_TABLET | ORAL | Status: AC
  Filled 2019-04-03: qty 2

## 2019-04-03 MED ORDER — DIPHENHYDRAMINE HCL 25 MG PO CAPS
ORAL_CAPSULE | ORAL | Status: AC
  Filled 2019-04-03: qty 2

## 2019-04-03 MED ORDER — ACETAMINOPHEN 325 MG PO TABS
650.0000 mg | ORAL_TABLET | Freq: Once | ORAL | Status: AC
  Administered 2019-04-03: 12:00:00 650 mg via ORAL

## 2019-04-03 NOTE — Patient Instructions (Signed)
[place] [facility] Discharge Instructions  Received Feraheme infusion today. Follow-up as scheduled. Call clinic for any questions or concerns   Thank you for choosing [place] [facility] to provide your oncology and hematology care.  To afford each patient quality time with our provider, please arrive at least 15 minutes before your scheduled appointment time.   If you have a lab appointment with the [place] please come in thru the Main Entrance and check in at the main information desk.  You need to re-schedule your appointment should you arrive 10 or more minutes late.  We strive to give you quality time with our providers, and arriving late affects you and other patients whose appointments are after yours.  Also, if you no show three or more times for appointments you may be dismissed from the clinic at the providers discretion.     Again, thank you for choosing [facility].  Our hope is that these requests will decrease the amount of time that you wait before being seen by our physicians.       _____________________________________________________________  Should you have questions after your visit to [facility], please contact our office at [phone] between the hours of 8:00 a.m. and 4:30 p.m.  Voicemails left after 4:00 p.m. will not be returned until the following business day.  For prescription refill requests, have your pharmacy contact our office and allow 72 hours.    Due to Covid, you will need to wear a mask upon entering the hospital. If you do not have a mask, a mask will be given to you at the Main Entrance upon arrival. For doctor visits, patients may have 1 support person with them. For treatment visits, patients can not have anyone with them due to social distancing guidelines and our immunocompromised population.

## 2019-04-03 NOTE — Progress Notes (Signed)
Gregory Clarke tolerated Feraheme infusion well without complaints or incident. Peripheral IV site checked with positive blood return noted prior ot and after infusion. VSS upon discharge. Pt discharged self ambulatory in satisfactory condition

## 2019-04-06 ENCOUNTER — Other Ambulatory Visit (INDEPENDENT_AMBULATORY_CARE_PROVIDER_SITE_OTHER): Payer: Self-pay | Admitting: *Deleted

## 2019-04-06 DIAGNOSIS — R197 Diarrhea, unspecified: Secondary | ICD-10-CM

## 2019-04-06 DIAGNOSIS — D500 Iron deficiency anemia secondary to blood loss (chronic): Secondary | ICD-10-CM

## 2019-04-30 ENCOUNTER — Ambulatory Visit (HOSPITAL_COMMUNITY): Payer: Self-pay | Admitting: Nurse Practitioner

## 2019-04-30 ENCOUNTER — Other Ambulatory Visit (HOSPITAL_COMMUNITY): Payer: Self-pay

## 2019-05-05 ENCOUNTER — Other Ambulatory Visit (HOSPITAL_COMMUNITY): Payer: Self-pay | Admitting: *Deleted

## 2019-05-05 DIAGNOSIS — D500 Iron deficiency anemia secondary to blood loss (chronic): Secondary | ICD-10-CM

## 2019-05-05 DIAGNOSIS — D509 Iron deficiency anemia, unspecified: Secondary | ICD-10-CM

## 2019-05-06 ENCOUNTER — Ambulatory Visit (HOSPITAL_COMMUNITY): Payer: Self-pay | Admitting: Nurse Practitioner

## 2019-05-06 ENCOUNTER — Inpatient Hospital Stay (HOSPITAL_COMMUNITY): Payer: Self-pay

## 2019-05-06 DIAGNOSIS — Z87442 Personal history of urinary calculi: Secondary | ICD-10-CM | POA: Insufficient documentation

## 2019-05-06 DIAGNOSIS — R591 Generalized enlarged lymph nodes: Secondary | ICD-10-CM | POA: Insufficient documentation

## 2019-05-06 DIAGNOSIS — K5100 Ulcerative (chronic) pancolitis without complications: Secondary | ICD-10-CM | POA: Insufficient documentation

## 2019-05-06 DIAGNOSIS — Z79899 Other long term (current) drug therapy: Secondary | ICD-10-CM | POA: Insufficient documentation

## 2019-05-06 DIAGNOSIS — D509 Iron deficiency anemia, unspecified: Secondary | ICD-10-CM | POA: Insufficient documentation

## 2019-05-06 DIAGNOSIS — Z7952 Long term (current) use of systemic steroids: Secondary | ICD-10-CM | POA: Insufficient documentation

## 2019-05-06 DIAGNOSIS — J45909 Unspecified asthma, uncomplicated: Secondary | ICD-10-CM | POA: Insufficient documentation

## 2019-05-06 DIAGNOSIS — K5090 Crohn's disease, unspecified, without complications: Secondary | ICD-10-CM | POA: Insufficient documentation

## 2019-05-07 ENCOUNTER — Inpatient Hospital Stay (HOSPITAL_COMMUNITY): Payer: Self-pay

## 2019-05-07 ENCOUNTER — Other Ambulatory Visit (HOSPITAL_COMMUNITY): Payer: Self-pay | Admitting: Nurse Practitioner

## 2019-05-07 ENCOUNTER — Other Ambulatory Visit: Payer: Self-pay

## 2019-05-07 DIAGNOSIS — D500 Iron deficiency anemia secondary to blood loss (chronic): Secondary | ICD-10-CM

## 2019-05-07 LAB — CBC WITH DIFFERENTIAL/PLATELET
Abs Immature Granulocytes: 0.03 10*3/uL (ref 0.00–0.07)
Basophils Absolute: 0 10*3/uL (ref 0.0–0.1)
Basophils Relative: 0 %
Eosinophils Absolute: 0.1 10*3/uL (ref 0.0–0.5)
Eosinophils Relative: 1 %
HCT: 43.8 % (ref 39.0–52.0)
Hemoglobin: 13.7 g/dL (ref 13.0–17.0)
Immature Granulocytes: 0 %
Lymphocytes Relative: 16 %
Lymphs Abs: 1.5 10*3/uL (ref 0.7–4.0)
MCH: 26.8 pg (ref 26.0–34.0)
MCHC: 31.3 g/dL (ref 30.0–36.0)
MCV: 85.5 fL (ref 80.0–100.0)
Monocytes Absolute: 0.5 10*3/uL (ref 0.1–1.0)
Monocytes Relative: 5 %
Neutro Abs: 7.3 10*3/uL (ref 1.7–7.7)
Neutrophils Relative %: 78 %
Platelets: 378 10*3/uL (ref 150–400)
RBC: 5.12 MIL/uL (ref 4.22–5.81)
RDW: 25.5 % — ABNORMAL HIGH (ref 11.5–15.5)
WBC: 9.5 10*3/uL (ref 4.0–10.5)
nRBC: 0 % (ref 0.0–0.2)

## 2019-05-07 LAB — COMPREHENSIVE METABOLIC PANEL
ALT: 37 U/L (ref 0–44)
AST: 21 U/L (ref 15–41)
Albumin: 4.3 g/dL (ref 3.5–5.0)
Alkaline Phosphatase: 94 U/L (ref 38–126)
Anion gap: 10 (ref 5–15)
BUN: 18 mg/dL (ref 6–20)
CO2: 23 mmol/L (ref 22–32)
Calcium: 9.4 mg/dL (ref 8.9–10.3)
Chloride: 105 mmol/L (ref 98–111)
Creatinine, Ser: 0.88 mg/dL (ref 0.61–1.24)
GFR calc Af Amer: >60 mL/min (ref >60)
GFR calc non Af Amer: >60 mL/min (ref >60)
Glucose, Bld: 107 mg/dL — ABNORMAL HIGH (ref 70–99)
Potassium: 4.1 mmol/L (ref 3.5–5.1)
Sodium: 138 mmol/L (ref 135–145)
Total Bilirubin: 0.6 mg/dL (ref 0.3–1.2)
Total Protein: 8 g/dL (ref 6.5–8.1)

## 2019-05-07 LAB — FERRITIN: Ferritin: 27 ng/mL (ref 24–336)

## 2019-05-07 LAB — IRON AND TIBC
Iron: 41 ug/dL — ABNORMAL LOW (ref 45–182)
Saturation Ratios: 9 % — ABNORMAL LOW (ref 17.9–39.5)
TIBC: 469 ug/dL — ABNORMAL HIGH (ref 250–450)
UIBC: 428 ug/dL

## 2019-05-07 LAB — VITAMIN B12: Vitamin B-12: 322 pg/mL (ref 180–914)

## 2019-05-07 LAB — VITAMIN D 25 HYDROXY (VIT D DEFICIENCY, FRACTURES): Vit D, 25-Hydroxy: 32.32 ng/mL (ref 30–100)

## 2019-05-07 LAB — LACTATE DEHYDROGENASE: LDH: 130 U/L (ref 98–192)

## 2019-05-08 ENCOUNTER — Inpatient Hospital Stay (HOSPITAL_COMMUNITY): Payer: Self-pay | Attending: Nurse Practitioner | Admitting: Nurse Practitioner

## 2019-05-08 ENCOUNTER — Other Ambulatory Visit: Payer: Self-pay

## 2019-05-08 DIAGNOSIS — D500 Iron deficiency anemia secondary to blood loss (chronic): Secondary | ICD-10-CM

## 2019-05-08 NOTE — Assessment & Plan Note (Addendum)
1.  Iron deficiency anemia: -Presented with bright red bleeding per rectum, diarrhea, and abdominal pain x1 year. -Labs done on 03/11/2019 showed hemoglobin 7.5, MCV 70.9, platelets 588, ferritin 9, percent saturation 2. -CT of the abdomen and pelvis on 03/11/2019 revealed lymph nodes throughout the mesentery of both small bowel and colon.  The most prominent lymph nodes are near the splenic flexure and within the mid abdomen.  The largest node in the left upper quadrant measures 12 mm.  Also of note nodule seen within the mesentery of the colon from the cecum through the sigmoid colon.  This is likely reactive from underlying inflammatory bowel disease. -Patient has underlying Crohn's disease.  Lymphadenopathy seen in the small bowel and colon is another common manifestations of the Crohn's disease. -Patient has no B symptoms.  Patient denies any joint pain.  He does have shortness of breath with activity. -He was given 2 infusions of IV iron on 03/20/2019 and 04/03/2019. -He had a colonoscopy on 03/31/2019 that showed examined portion of the ileum was normal.  The ascending colon and cecum were normal.  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 -Labs done on 05/07/2019 hemoglobin 13.7, ferritin 27, percent saturation 9, platelets 378 -She is also currently taking oral iron tablets. -He will follow-up in 2 months with labs

## 2019-05-08 NOTE — Patient Instructions (Signed)
[facility] Discharge Instructions     Thank you for choosing [facility] to provide your oncology and hematology care.  To afford each patient quality time with our provider, please arrive at least 15 minutes before your scheduled appointment time.   If you have a lab appointment with the Cancer Center please come in thru the Main Entrance and check in at the main information desk.  You need to re-schedule your appointment should you arrive 10 or more minutes late.  We strive to give you quality time with our providers, and arriving late affects you and other patients whose appointments are after yours.  Also, if you no show three or more times for appointments you may be dismissed from the clinic at the providers discretion.     Again, thank you for choosing [facility].  Our hope is that these requests will decrease the amount of time that you wait before being seen by our physicians.       _____________________________________________________________  Should you have questions after your visit to [facility], please contact our office at [phone] between the hours of 8:00 a.m. and 4:30 p.m.  Voicemails left after 4:00 p.m. will not be returned until the following business day.  For prescription refill requests, have your pharmacy contact our office and allow 72 hours.    Due to Covid, you will need to wear a mask upon entering the hospital. If you do not have a mask, a mask will be given to you at the Main Entrance upon arrival. For doctor visits, patients may have 1 support person with them. For treatment visits, patients can not have anyone with them due to social distancing guidelines and our immunocompromised population.      

## 2019-05-08 NOTE — Progress Notes (Signed)
[place], [place] [address]   CLINIC:  Medical Oncology/Hematology  PCP:  System, Provider Not In No address on file None   REASON FOR VISIT: Follow-up for iron deficiency anemia  CURRENT THERAPY: Intermittent iron infusions   INTERVAL HISTORY:  [name] 24 y.o. male returns for routine follow-up for iron deficiency anemia.  Patient reports he is doing well since his last 2 iron infusions.  His energy levels have improved.  He does report occasional bleeding per rectum.  He reports it is much better since starting the new medication for ulcerative colitis. Denies any nausea, vomiting, or diarrhea. Denies any new pains. Had not noticed any recent bleeding such as epistaxis, hematuria or hematochezia. Denies recent chest pain on exertion, shortness of breath on minimal exertion, pre-syncopal episodes, or palpitations. Denies any numbness or tingling in hands or feet. Denies any recent fevers, infections, or recent hospitalizations. Patient reports appetite at 100% and energy level at 75%.  He is eating well maintain his weight at this time.     REVIEW OF SYSTEMS:  Review of Systems  Respiratory: Positive for cough.   Gastrointestinal: Positive for blood in stool (Occasionally) and diarrhea.  Neurological: Positive for dizziness.  All other systems reviewed and are negative.    PAST MEDICAL/SURGICAL HISTORY:  Past Medical History:  Diagnosis Date  . Anemia   . Asthma   . History of kidney stones   . Kidney stones 03/19/2019   Past Surgical History:  Procedure Laterality Date  . BIOPSY  03/31/2019   Procedure: BIOPSY;  Surgeon: Rogene Houston, MD;  [facility];  Service: Endoscopy;;  . COLONOSCOPY WITH PROPOFOL N/A 03/31/2019   Procedure: COLONOSCOPY WITH PROPOFOL;  Surgeon: Rogene Houston, MD;  [facility]: [facility];  Service: Endoscopy;  Laterality: N/A;  7:30  . TONSILLECTOMY       SOCIAL HISTORY:  Social  History   Socioeconomic History  . Marital status: Single    Spouse name: Not on file  . Number of children: 0  . Years of education: Not on file  . Highest education level: Not on file  Occupational History  . Occupation: umemployed  Tobacco Use  . Smoking status: Never Smoker  . Smokeless tobacco: Never Used  Substance and Sexual Activity  . Alcohol use: No  . Drug use: Yes    Types: Marijuana    Comment: 03/26/19  . Sexual activity: Yes  Other Topics Concern  . Not on file  Social History Narrative  . Not on file   Social Determinants of Health   Financial Resource Strain:   . Difficulty of Paying Living Expenses: Not on file  Food Insecurity:   . Worried About Charity fundraiser in the Last Year: Not on file  . Ran Out of Food in the Last Year: Not on file  Transportation Needs:   . Lack of Transportation (Medical): Not on file  . Lack of Transportation (Non-Medical): Not on file  Physical Activity:   . Days of Exercise per Week: Not on file  . Minutes of Exercise per Session: Not on file  Stress:   . Feeling of Stress : Not on file  Social Connections:   . Frequency of Communication with Friends and Family: Not on file  . Frequency of Social Gatherings with Friends and Family: Not on file  . Attends Religious Services: Not on file  . Active Member of Clubs or Organizations:  Not on file  . Attends Archivist Meetings: Not on file  . Marital Status: Not on file  Intimate Partner Violence:   . Fear of Current or Ex-Partner: Not on file  . Emotionally Abused: Not on file  . Physically Abused: Not on file  . Sexually Abused: Not on file    FAMILY HISTORY:  Family History  Problem Relation Age of Onset  . Healthy Mother   . Healthy Father   . Healthy Sister   . Healthy Brother     CURRENT MEDICATIONS:  Outpatient Encounter Medications as of 05/08/2019  Medication Sig  . Cholecalciferol (VITAMIN D) 125 MCG (5000 UT) CAPS Take 5,000 Units/day by  mouth daily.  . ferrous sulfate 325 (65 FE) MG tablet Take 1 tablet (325 mg total) by mouth daily.  . predniSONE (DELTASONE) 10 MG tablet Take 3 tablets (30 mg total) by mouth daily with breakfast. Drop dose by 5 mg every week until finished  . albuterol (VENTOLIN HFA) 108 (90 Base) MCG/ACT inhaler Inhale 2 puffs into the lungs every 6 (six) hours as needed for wheezing or shortness of breath.   . dicyclomine (BENTYL) 10 MG capsule Take 1 capsule (10 mg total) by mouth 3 (three) times daily as needed for spasms. (Patient not taking: Reported on 05/08/2019)  . mupirocin ointment (BACTROBAN) 2 % Place 1 application into the nose daily as needed (irritation).   No facility-administered encounter medications on file as of 05/08/2019.    ALLERGIES:  No Known Allergies   PHYSICAL EXAM:  ECOG Performance status: 1  Vitals:   05/08/19 1024  BP: 123/84  Pulse: 72  Resp: 19  Temp: 97.9 F (36.6 C)  SpO2: 98%   Filed Weights   05/08/19 1024  Weight: 247 lb 12.8 oz (112.4 kg)    Physical Exam Constitutional:      Appearance: Normal appearance. He is normal weight.  Cardiovascular:     Rate and Rhythm: Normal rate and regular rhythm.     Heart sounds: Normal heart sounds.  Pulmonary:     Effort: Pulmonary effort is normal.     Breath sounds: Normal breath sounds.  Abdominal:     General: Bowel sounds are normal.     Palpations: Abdomen is soft.  Musculoskeletal:        General: Normal range of motion.  Skin:    General: Skin is warm.  Neurological:     Mental Status: He is alert and oriented to person, place, and time. Mental status is at baseline.  Psychiatric:        Mood and Affect: Mood normal.        Behavior: Behavior normal.        Thought Content: Thought content normal.        Judgment: Judgment normal.      LABORATORY DATA:  I have reviewed the labs as listed.  CBC    Component Value Date/Time   WBC 9.5 05/07/2019 1420   RBC 5.12 05/07/2019 1420   HGB 13.7  05/07/2019 1420   HCT 43.8 05/07/2019 1420   PLT 378 05/07/2019 1420   MCV 85.5 05/07/2019 1420   MCH 26.8 05/07/2019 1420   MCHC 31.3 05/07/2019 1420   RDW 25.5 (H) 05/07/2019 1420   LYMPHSABS 1.5 05/07/2019 1420   MONOABS 0.5 05/07/2019 1420   EOSABS 0.1 05/07/2019 1420   BASOSABS 0.0 05/07/2019 1420   CMP Latest Ref Rng & Units 05/07/2019 03/11/2019  Glucose 70 - 99  mg/dL 107(H) 91  BUN 6 - 20 mg/dL 18 12  Creatinine 0.61 - 1.24 mg/dL 0.88 0.95  Sodium 135 - 145 mmol/L 138 136  Potassium 3.5 - 5.1 mmol/L 4.1 4.1  Chloride 98 - 111 mmol/L 105 102  CO2 22 - 32 mmol/L 23 24  Calcium 8.9 - 10.3 mg/dL 9.4 9.0  Total Protein 6.5 - 8.1 g/dL 8.0 8.0  Total Bilirubin 0.3 - 1.2 mg/dL 0.6 0.4  Alkaline Phos 38 - 126 U/L 94 92  AST 15 - 41 U/L 21 24  ALT 0 - 44 U/L 37 33     I personally performed a face-to-face visit.  All questions were answered to patient's stated satisfaction. Encouraged patient to call with any new concerns or questions before his next visit to the cancer center and we can certain see him sooner, if needed.     ASSESSMENT & PLAN:   Iron deficiency anemia due to chronic blood loss 1.  Iron deficiency anemia: -Presented with bright red bleeding per rectum, diarrhea, and abdominal pain x1 year. -Labs done on 03/11/2019 showed hemoglobin 7.5, MCV 70.9, platelets 588, ferritin 9, percent saturation 2. -CT of the abdomen and pelvis on 03/11/2019 revealed lymph nodes throughout the mesentery of both small bowel and colon.  The most prominent lymph nodes are near the splenic flexure and within the mid abdomen.  The largest node in the left upper quadrant measures 12 mm.  Also of note nodule seen within the mesentery of the colon from the cecum through the sigmoid colon.  This is likely reactive from underlying inflammatory bowel disease. -Patient has underlying Crohn's disease.  Lymphadenopathy seen in the small bowel and colon is another common manifestations of the  Crohn's disease. -Patient has no B symptoms.  Patient denies any joint pain.  He does have shortness of breath with activity. -He was given 2 infusions of IV iron on 03/20/2019 and 04/03/2019. -He had a colonoscopy on 03/31/2019 that showed examined portion of the ileum was normal.  The ascending colon and cecum were normal.  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 -Labs done on 05/07/2019 hemoglobin 13.7, ferritin 27, percent saturation 9, platelets 378 -She is also currently taking oral iron tablets. -He will follow-up in 2 months with labs       Orders placed this encounter:  Orders Placed This Encounter  Procedures  . Lactate dehydrogenase  . CBC with Differential/Platelet  . Comprehensive metabolic panel  . Ferritin  . Iron and TIBC  . Vitamin B12  . VITAMIN D 25 Hydroxy (Vit-D Deficiency, Fractures)      [place] [phone]

## 2019-07-06 ENCOUNTER — Telehealth (INDEPENDENT_AMBULATORY_CARE_PROVIDER_SITE_OTHER): Payer: Self-pay | Admitting: Internal Medicine

## 2019-07-06 NOTE — Telephone Encounter (Signed)
Patient left voice mail message stating Dr Laural Golden was to call him back about a new type of medication - stated the original medication he was put on he has been out for 3 weeks - he was also given the date and time of his next appointment - please advise - ph# [phone]

## 2019-07-07 ENCOUNTER — Inpatient Hospital Stay (HOSPITAL_COMMUNITY): Payer: Self-pay | Attending: Hematology

## 2019-07-07 ENCOUNTER — Other Ambulatory Visit: Payer: Self-pay

## 2019-07-07 DIAGNOSIS — D500 Iron deficiency anemia secondary to blood loss (chronic): Secondary | ICD-10-CM

## 2019-07-07 DIAGNOSIS — D509 Iron deficiency anemia, unspecified: Secondary | ICD-10-CM | POA: Insufficient documentation

## 2019-07-07 DIAGNOSIS — Z79899 Other long term (current) drug therapy: Secondary | ICD-10-CM | POA: Insufficient documentation

## 2019-07-07 LAB — CBC WITH DIFFERENTIAL/PLATELET
Abs Immature Granulocytes: 0.02 10*3/uL (ref 0.00–0.07)
Basophils Absolute: 0.1 10*3/uL (ref 0.0–0.1)
Basophils Relative: 1 %
Eosinophils Absolute: 0.7 10*3/uL — ABNORMAL HIGH (ref 0.0–0.5)
Eosinophils Relative: 8 %
HCT: 45.8 % (ref 39.0–52.0)
Hemoglobin: 15.4 g/dL (ref 13.0–17.0)
Immature Granulocytes: 0 %
Lymphocytes Relative: 24 %
Lymphs Abs: 2 10*3/uL (ref 0.7–4.0)
MCH: 29.6 pg (ref 26.0–34.0)
MCHC: 33.6 g/dL (ref 30.0–36.0)
MCV: 88.1 fL (ref 80.0–100.0)
Monocytes Absolute: 0.6 10*3/uL (ref 0.1–1.0)
Monocytes Relative: 8 %
Neutro Abs: 4.9 10*3/uL (ref 1.7–7.7)
Neutrophils Relative %: 59 %
Platelets: 364 10*3/uL (ref 150–400)
RBC: 5.2 MIL/uL (ref 4.22–5.81)
RDW: 15.5 % (ref 11.5–15.5)
WBC: 8.2 10*3/uL (ref 4.0–10.5)
nRBC: 0 % (ref 0.0–0.2)

## 2019-07-07 LAB — FERRITIN: Ferritin: 19 ng/mL — ABNORMAL LOW (ref 24–336)

## 2019-07-07 LAB — VITAMIN D 25 HYDROXY (VIT D DEFICIENCY, FRACTURES): Vit D, 25-Hydroxy: 27.25 ng/mL — ABNORMAL LOW (ref 30–100)

## 2019-07-07 LAB — COMPREHENSIVE METABOLIC PANEL
ALT: 30 U/L (ref 0–44)
AST: 22 U/L (ref 15–41)
Albumin: 4.4 g/dL (ref 3.5–5.0)
Alkaline Phosphatase: 104 U/L (ref 38–126)
Anion gap: 9 (ref 5–15)
BUN: 13 mg/dL (ref 6–20)
CO2: 26 mmol/L (ref 22–32)
Calcium: 9.7 mg/dL (ref 8.9–10.3)
Chloride: 101 mmol/L (ref 98–111)
Creatinine, Ser: 1 mg/dL (ref 0.61–1.24)
GFR calc Af Amer: >60 mL/min (ref >60)
GFR calc non Af Amer: >60 mL/min (ref >60)
Glucose, Bld: 90 mg/dL (ref 70–99)
Potassium: 4.3 mmol/L (ref 3.5–5.1)
Sodium: 136 mmol/L (ref 135–145)
Total Bilirubin: 0.6 mg/dL (ref 0.3–1.2)
Total Protein: 8.4 g/dL — ABNORMAL HIGH (ref 6.5–8.1)

## 2019-07-07 LAB — IRON AND TIBC
Iron: 63 ug/dL (ref 45–182)
Saturation Ratios: 13 % — ABNORMAL LOW (ref 17.9–39.5)
TIBC: 482 ug/dL — ABNORMAL HIGH (ref 250–450)
UIBC: 419 ug/dL

## 2019-07-07 LAB — VITAMIN B12: Vitamin B-12: 251 pg/mL (ref 180–914)

## 2019-07-07 LAB — LACTATE DEHYDROGENASE: LDH: 163 U/L (ref 98–192)

## 2019-07-09 ENCOUNTER — Inpatient Hospital Stay (HOSPITAL_COMMUNITY): Payer: Self-pay | Admitting: Nurse Practitioner

## 2019-07-09 ENCOUNTER — Encounter (HOSPITAL_COMMUNITY): Payer: Self-pay | Admitting: Nurse Practitioner

## 2019-07-09 NOTE — Progress Notes (Signed)
Telehealth Consent

## 2019-07-10 ENCOUNTER — Inpatient Hospital Stay (HOSPITAL_BASED_OUTPATIENT_CLINIC_OR_DEPARTMENT_OTHER): Payer: Self-pay | Admitting: Nurse Practitioner

## 2019-07-10 DIAGNOSIS — D500 Iron deficiency anemia secondary to blood loss (chronic): Secondary | ICD-10-CM

## 2019-07-10 NOTE — Progress Notes (Signed)
[facility] Follow up:    System, Provider Not In No address on file   DIAGNOSIS: Iron deficiency anemia  CURRENT THERAPY: Oral iron therapy  INTERVAL HISTORY: Gregory Clarke 24 y.o. male returns for iron deficiency anemia due to chronic blood loss.  Patient reports he is doing well since last visit.  He does report he is having more blood in his stool.  It is felt is due to his Crohn disease he is seeing Dr. Laural Golden frequently to address this problem. Denies any nausea, vomiting, or diarrhea. Denies any new pains. Had not noticed any recent bleeding such as epistaxis, hematuria. Denies recent chest pain on exertion, shortness of breath on minimal exertion, pre-syncopal episodes, or palpitations. Denies any numbness or tingling in hands or feet. Denies any recent fevers, infections, or recent hospitalizations. Patient reports appetite at 100% and energy level at 75%.  He is eating well maintain his weight this time.    Patient Active Problem List   Diagnosis Date Noted  . Mesenteric lymphadenopathy 03/19/2019  . Iron deficiency anemia due to chronic blood loss 03/17/2019  . IDA (iron deficiency anemia) 03/16/2019  . Bloody diarrhea 03/16/2019  . Foot fracture 09/09/2014    has No Known Allergies.  MEDICAL HISTORY: Past Medical History:  Diagnosis Date  . Anemia   . Asthma   . History of kidney stones   . Kidney stones 03/19/2019    SURGICAL HISTORY: Past Surgical History:  Procedure Laterality Date  . BIOPSY  03/31/2019   Procedure: BIOPSY;  Surgeon: Rogene Houston, MD;  [facility];  Service: Endoscopy;;  . COLONOSCOPY WITH PROPOFOL N/A 03/31/2019   Procedure: COLONOSCOPY WITH PROPOFOL;  Surgeon: Rogene Houston, MD;  [facility];  Service: Endoscopy;  Laterality: N/A;  7:30  . TONSILLECTOMY      SOCIAL HISTORY: Social History   Socioeconomic History  . Marital status: Single    Spouse name: Not on file  . Number of  children: 0  . Years of education: Not on file  . Highest education level: Not on file  Occupational History  . Occupation: umemployed  Tobacco Use  . Smoking status: Never Smoker  . Smokeless tobacco: Never Used  Substance and Sexual Activity  . Alcohol use: No  . Drug use: Yes    Types: Marijuana    Comment: 03/26/19  . Sexual activity: Yes  Other Topics Concern  . Not on file  Social History Narrative  . Not on file   Social Determinants of Health   Financial Resource Strain:   . Difficulty of Paying Living Expenses:   Food Insecurity:   . Worried About Charity fundraiser in the Last Year:   . Arboriculturist in the Last Year:   Transportation Needs:   . Film/video editor (Medical):   Marland Kitchen Lack of Transportation (Non-Medical):   Physical Activity:   . Days of Exercise per Week:   . Minutes of Exercise per Session:   Stress:   . Feeling of Stress :   Social Connections:   . Frequency of Communication with Friends and Family:   . Frequency of Social Gatherings with Friends and Family:   . Attends Religious Services:   . Active Member of Clubs or Organizations:   . Attends Archivist Meetings:   Marland Kitchen Marital Status:   Intimate Partner Violence:   . Fear of Current or Ex-Partner:   . Emotionally Abused:   .  Physically Abused:   . Sexually Abused:     FAMILY HISTORY: Family History  Problem Relation Age of Onset  . Healthy Mother   . Healthy Father   . Healthy Sister   . Healthy Brother     Review of Systems  Gastrointestinal: Positive for blood in stool.  All other systems reviewed and are negative.     Eitel signs: -Deferred to telephone visit  Physical Exam -Deferred due to telephone visit -Patient was alert and oriented over the phone and in no acute distress   LABORATORY DATA:  CBC    Component Value Date/Time   WBC 8.2 07/07/2019 1430   RBC 5.20 07/07/2019 1430   HGB 15.4 07/07/2019 1430   HCT 45.8 07/07/2019 1430   PLT 364  07/07/2019 1430   MCV 88.1 07/07/2019 1430   MCH 29.6 07/07/2019 1430   MCHC 33.6 07/07/2019 1430   RDW 15.5 07/07/2019 1430   LYMPHSABS 2.0 07/07/2019 1430   MONOABS 0.6 07/07/2019 1430   EOSABS 0.7 (H) 07/07/2019 1430   BASOSABS 0.1 07/07/2019 1430    CMP     Component Value Date/Time   NA 136 07/07/2019 1430   K 4.3 07/07/2019 1430   CL 101 07/07/2019 1430   CO2 26 07/07/2019 1430   GLUCOSE 90 07/07/2019 1430   BUN 13 07/07/2019 1430   CREATININE 1.00 07/07/2019 1430   CALCIUM 9.7 07/07/2019 1430   PROT 8.4 (H) 07/07/2019 1430   ALBUMIN 4.4 07/07/2019 1430   AST 22 07/07/2019 1430   ALT 30 07/07/2019 1430   ALKPHOS 104 07/07/2019 1430   BILITOT 0.6 07/07/2019 1430   GFRNONAA >60 07/07/2019 1430   GFRAA >60 07/07/2019 1430    All questions were answered to patient's stated satisfaction. Encouraged patient to call with any new concerns or questions before his next visit to the cancer center and we can certain see him sooner, if needed.     ASSESSMENT and THERAPY PLAN:   Iron deficiency anemia due to chronic blood loss 1.  Iron deficiency anemia: -Presented with bright red bleeding per rectum, diarrhea, and abdominal pain x1 year. -Labs done on 03/11/2019 showed hemoglobin 7.5, MCV 70.9, platelets 588, ferritin 9, percent saturation 2. -CT of the abdomen and pelvis on 03/11/2019 revealed lymph nodes throughout the mesentery of both small bowel and colon.  The most prominent lymph nodes are near the splenic flexure and within the mid abdomen.  The largest node in the left upper quadrant measures 12 mm.  Also of note nodule seen within the mesentery of the colon from the cecum through the sigmoid colon.  This is likely reactive from underlying inflammatory bowel disease. -Patient has underlying Crohn's disease.  Lymphadenopathy seen in the small bowel and colon is another common manifestations of the Crohn's disease. -Patient has no B symptoms.  Patient denies any joint pain.   He does have shortness of breath with activity. -He was given 2 infusions of IV iron on 03/20/2019 and 04/03/2019. -He had a colonoscopy on 03/31/2019 that showed examined portion of the ileum was normal.  The ascending colon and cecum were normal.  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 -Labs done on 07/07/2019 hemoglobin 15.4, ferritin 19, percent saturation 13, platelets 364 -He will continue taking oral tablets daily. -He reports he is having bleeding in his stool.  He has a follow-up appointment with Dr. Laural Golden on May 6 to address this.  Most likely due to his Crohn's disease.  He  was having bleeding and he was put on steroids.  Once the steroids stopped the bleeding return. -He will follow-up in 4 months with labs    Orders Placed This Encounter  Procedures  . Lactate dehydrogenase    Standing Status:   Future    Standing Expiration Date:   07/09/2020  . CBC with Differential/Platelet    Standing Status:   Future    Standing Expiration Date:   07/09/2020  . Comprehensive metabolic panel    Standing Status:   Future    Standing Expiration Date:   07/09/2020  . Ferritin    Standing Status:   Future    Standing Expiration Date:   07/09/2020  . Iron and TIBC    Standing Status:   Future    Standing Expiration Date:   07/09/2020  . VITAMIN D 25 Hydroxy (Vit-D Deficiency, Fractures)    Standing Status:   Future    Standing Expiration Date:   07/09/2020  . Vitamin B12    Standing Status:   Future    Standing Expiration Date:   07/09/2020  . Folate    Standing Status:   Future    Standing Expiration Date:   07/09/2020    All questions were answered. The patient knows to call the clinic with any problems, questions or concerns. We can certainly see the patient much sooner if necessary. This note was electronically signed.  I provided 30 minutes of non face-to-face telephone visit time during this encounter, and > 50% was spent counseling as documented under my assessment &  plan.   Glennie Isle, NP-C 07/10/2019

## 2019-07-10 NOTE — Assessment & Plan Note (Addendum)
1.  Iron deficiency anemia: -Presented with bright red bleeding per rectum, diarrhea, and abdominal pain x1 year. -Labs done on 03/11/2019 showed hemoglobin 7.5, MCV 70.9, platelets 588, ferritin 9, percent saturation 2. -CT of the abdomen and pelvis on 03/11/2019 revealed lymph nodes throughout the mesentery of both small bowel and colon.  The most prominent lymph nodes are near the splenic flexure and within the mid abdomen.  The largest node in the left upper quadrant measures 12 mm.  Also of note nodule seen within the mesentery of the colon from the cecum through the sigmoid colon.  This is likely reactive from underlying inflammatory bowel disease. -Patient has underlying Crohn's disease.  Lymphadenopathy seen in the small bowel and colon is another common manifestations of the Crohn's disease. -Patient has no B symptoms.  Patient denies any joint pain.  He does have shortness of breath with activity. -He was given 2 infusions of IV iron on 03/20/2019 and 04/03/2019. -He had a colonoscopy on 03/31/2019 that showed examined portion of the ileum was normal.  The ascending colon and cecum were normal.  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 -Labs done on 07/07/2019 hemoglobin 15.4, ferritin 19, percent saturation 13, platelets 364 -He will continue taking oral tablets daily. -He reports he is having bleeding in his stool.  He has a follow-up appointment with Dr. Laural Golden on May 6 to address this.  Most likely due to his Crohn's disease.  He was having bleeding and he was put on steroids.  Once the steroids stopped the bleeding return. -He will follow-up in 4 months with labs

## 2019-07-10 NOTE — Telephone Encounter (Signed)
Per Dr.Rehman the patient will need to be on a Mesalamine. Patient currently does not have Insurance. He will need patient assistance.

## 2019-07-21 ENCOUNTER — Ambulatory Visit (INDEPENDENT_AMBULATORY_CARE_PROVIDER_SITE_OTHER): Payer: Self-pay | Admitting: Internal Medicine

## 2019-07-23 ENCOUNTER — Other Ambulatory Visit: Payer: Self-pay

## 2019-07-23 ENCOUNTER — Ambulatory Visit (INDEPENDENT_AMBULATORY_CARE_PROVIDER_SITE_OTHER): Payer: Self-pay | Admitting: Internal Medicine

## 2019-07-23 ENCOUNTER — Encounter (INDEPENDENT_AMBULATORY_CARE_PROVIDER_SITE_OTHER): Payer: Self-pay | Admitting: Internal Medicine

## 2019-07-23 DIAGNOSIS — K51011 Ulcerative (chronic) pancolitis with rectal bleeding: Secondary | ICD-10-CM

## 2019-07-23 DIAGNOSIS — J45909 Unspecified asthma, uncomplicated: Secondary | ICD-10-CM

## 2019-07-23 DIAGNOSIS — K5190 Ulcerative colitis, unspecified, without complications: Secondary | ICD-10-CM | POA: Insufficient documentation

## 2019-07-23 MED ORDER — PREDNISONE 10 MG PO TABS: 10.0000 mg | ORAL_TABLET | Freq: Every day | ORAL | 0 refills | Status: DC

## 2019-07-23 MED ORDER — SULFASALAZINE 500 MG PO TBEC: 1000.0000 mg | DELAYED_RELEASE_TABLET | Freq: Two times a day (BID) | ORAL | 5 refills | Status: DC

## 2019-07-23 MED ORDER — FOLIC ACID 1 MG PO TABS: 1.0000 mg | ORAL_TABLET | Freq: Every day | ORAL | Status: DC

## 2019-07-23 MED ORDER — ALBUTEROL SULFATE HFA 108 (90 BASE) MCG/ACT IN AERS: 2.0000 | INHALATION_SPRAY | Freq: Four times a day (QID) | RESPIRATORY_TRACT | 2 refills | Status: DC | PRN

## 2019-07-23 NOTE — Patient Instructions (Signed)
Take prednisone 10 mg daily and if it does not work increase dose to 20 mg every morning. Begin taper after 2 to 3 weeks when you feel better and drop dose by 5 mg every week. Take sulfasalazine 1 g / 2 tablets after breakfast and evening meal. You will need to take folic acid 1 mg every day while you are taking sulfasalazine. Call with progress report in 1 month or earlier if you experience side effects with sulfasalazine.

## 2019-07-23 NOTE — Progress Notes (Signed)
Presenting complaint;  Follow-up for ulcerative colitis.  Database and subjective:  Patient is 24 year old Caucasian male who was last seen on 03/16/2019 for history of iron deficiency anemia and bloody diarrhea.  He underwent colonoscopy on 03/31/2019.  Terminal ileum was normal.  He had changes typical of ulcerative colitis with sparing of ascending colon.  Patient was treated with prednisone.  He does not have an insurance and we would not able to secure mesalamine for him.  He has been taking iron with correction of his iron deficiency anemia.  Patient states he was doing valve out on prednisone.  Since stopping the prednisone he has noted rectal bleeding.  He generally sees small amount of blood with his bowel movement.  He is having 1-2 stools per day.  He is starting to experience cramping again but it is not like it was before.  He has good appetite.  He has gained 9 pounds since her last visit.  Weight gain is felt to be due to prednisone use.  He does not take any NSAIDs. 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 He does not take OTC NSAIDs. Patient has history of bronchial asthma.  He has run out of albuterol inhaler.  He does not use it every day.  He is requesting a refill since he does not have a primary care physician and would not be able to afford a physician at the present time.  Current Medications: Outpatient Encounter Medications as of 07/23/2019  Medication Sig  . albuterol (VENTOLIN HFA) 108 (90 Base) MCG/ACT inhaler Inhale 2 puffs into the lungs every 6 (six) hours as needed for wheezing or shortness of breath.   . Cholecalciferol (VITAMIN D) 125 MCG (5000 UT) CAPS Take 5,000 Units/day by mouth daily.  . ferrous sulfate 325 (65 FE) MG tablet Take 1 tablet (325 mg total) by mouth daily.  . mupirocin ointment (BACTROBAN) 2 % Place 1 application into the nose daily as needed (irritation).  Marland Kitchen dicyclomine (BENTYL) 10 MG capsule Take  1 capsule (10 mg total) by mouth 3 (three) times daily as needed for spasms. (Patient not taking: Reported on 05/08/2019)  . predniSONE (DELTASONE) 10 MG tablet Take 3 tablets (30 mg total) by mouth daily with breakfast. Drop dose by 5 mg every week until finished (Patient not taking: Reported on 07/23/2019)   No facility-administered encounter medications on file as of 07/23/2019.     Objective: Blood pressure 118/75, pulse (!) 109, temperature (!) 97.1 F (36.2 C), temperature source Temporal, height 6' 2"  (1.88 m), weight 252 lb 1.6 oz (114.4 kg). Patient is alert and in no acute distress. He is wearing facial mask. Conjunctiva is pink. Sclera is nonicteric Oropharyngeal mucosa is normal. No neck masses or thyromegaly noted. Cardiac exam with regular rhythm normal S1 and S2. No murmur or gallop noted. Lungs are clear to auscultation. Abdomen is full but soft and nontender with no organomegaly or masses. No LE edema or clubbing noted.  Labs/studies Results:  CBC Latest Ref Rng & Units 07/07/2019 05/07/2019 03/27/2019  WBC 4.0 - 10.5 K/uL 8.2 9.5 8.7  Hemoglobin 13.0 - 17.0 g/dL 15.4 13.7 9.5(L)  Hematocrit 39.0 - 52.0 % 45.8 43.8 33.6(L)  Platelets 150 - 400 K/uL 364 378 471(H)    CMP Latest Ref Rng & Units 07/07/2019 05/07/2019 03/11/2019  Glucose 70 - 99 mg/dL 90 107(H) 91  BUN 6 - 20 mg/dL 13 18 12   Creatinine 0.61 -  1.24 mg/dL 1.00 0.88 0.95  Sodium 135 - 145 mmol/L 136 138 136  Potassium 3.5 - 5.1 mmol/L 4.3 4.1 4.1  Chloride 98 - 111 mmol/L 101 105 102  CO2 22 - 32 mmol/L 26 23 24   Calcium 8.9 - 10.3 mg/dL 9.7 9.4 9.0  Total Protein 6.5 - 8.1 g/dL 8.4(H) 8.0 8.0  Total Bilirubin 0.3 - 1.2 mg/dL 0.6 0.6 0.4  Alkaline Phos 38 - 126 U/L 104 94 92  AST 15 - 41 U/L 22 21 24   ALT 0 - 44 U/L 30 37 33    Hepatic Function Latest Ref Rng & Units 07/07/2019 05/07/2019 03/11/2019  Total Protein 6.5 - 8.1 g/dL 8.4(H) 8.0 8.0  Albumin 3.5 - 5.0 g/dL 4.4 4.3 3.7  AST 15 - 41 U/L 22 21 24    ALT 0 - 44 U/L 30 37 33  Alk Phosphatase 38 - 126 U/L 104 94 92  Total Bilirubin 0.3 - 1.2 mg/dL 0.6 0.6 0.4     Assessment:  #1.  Pan ulcerative colitis.  He was diagnosed about 15 weeks ago.  He responded to prednisone but he is not on a maintenance therapy yet.  His symptoms are relapsing off prednisone.  #2.  History of iron deficiency anemia.  Anemia has fully corrected with p.o. iron and decreasing GI blood loss due to colitis.  #3.  History of bronchial asthma.  He is not having any symptoms.  Will refill albuterol that he is using on as-needed basis.   Plan:  Resume prednisone at a dose of 20 mg by mouth daily.  Consider dropping dose after 2 weeks.  Drop dose by 5 mg every week until is finished. Begin sulfasalazine 1 g by mouth after breakfast and evening meal daily. Folic acid 1 mg by mouth daily. New prescription given for albuterol inhaler to be used on as-needed basis. Patient advised to call office with progress report in 1 month or earlier if he experiences side effects with sulfasalazine. We will also try to obtain oral mesalamine for him if possible. Office visit in 6 months.

## 2019-07-24 NOTE — Telephone Encounter (Signed)
Patient was seen in the office 07/23/2019.  Take prednisone 10 mg daily and if it does not work increase dose to 20 mg every morning. Begin taper after 2 to 3 weeks when you feel better and drop dose by 5 mg every week. Take sulfasalazine 1 g / 2 tablets after breakfast and evening meal. You will need to take folic acid 1 mg every day while you are taking sulfasalazine. Call with progress report in 1 month or earlier if you experience side effects with sulfasalazine.

## 2019-08-29 ENCOUNTER — Emergency Department (HOSPITAL_COMMUNITY): Payer: Self-pay

## 2019-08-29 ENCOUNTER — Emergency Department (HOSPITAL_COMMUNITY)
Admission: EM | Admit: 2019-08-29 | Discharge: 2019-08-29 | Disposition: A | Discharge status: Home / Self Care | Payer: Self-pay | Attending: Emergency Medicine | Admitting: Emergency Medicine

## 2019-08-29 ENCOUNTER — Encounter (HOSPITAL_COMMUNITY): Payer: Self-pay | Admitting: *Deleted

## 2019-08-29 ENCOUNTER — Other Ambulatory Visit: Payer: Self-pay

## 2019-08-29 DIAGNOSIS — J45909 Unspecified asthma, uncomplicated: Secondary | ICD-10-CM | POA: Insufficient documentation

## 2019-08-29 DIAGNOSIS — R1033 Periumbilical pain: Secondary | ICD-10-CM

## 2019-08-29 DIAGNOSIS — K529 Noninfective gastroenteritis and colitis, unspecified: Secondary | ICD-10-CM | POA: Insufficient documentation

## 2019-08-29 DIAGNOSIS — Z79899 Other long term (current) drug therapy: Secondary | ICD-10-CM | POA: Insufficient documentation

## 2019-08-29 HISTORY — DX: Ulcerative colitis, unspecified, without complications: K51.90

## 2019-08-29 LAB — CBC WITH DIFFERENTIAL/PLATELET
Abs Immature Granulocytes: 0.03 10*3/uL (ref 0.00–0.07)
Basophils Absolute: 0.1 10*3/uL (ref 0.0–0.1)
Basophils Relative: 1 %
Eosinophils Absolute: 0.2 10*3/uL (ref 0.0–0.5)
Eosinophils Relative: 1 %
HCT: 43.4 % (ref 39.0–52.0)
Hemoglobin: 14.4 g/dL (ref 13.0–17.0)
Immature Granulocytes: 0 %
Lymphocytes Relative: 10 %
Lymphs Abs: 1.2 10*3/uL (ref 0.7–4.0)
MCH: 30.4 pg (ref 26.0–34.0)
MCHC: 33.2 g/dL (ref 30.0–36.0)
MCV: 91.8 fL (ref 80.0–100.0)
Monocytes Absolute: 1 10*3/uL (ref 0.1–1.0)
Monocytes Relative: 9 %
Neutro Abs: 9.2 10*3/uL — ABNORMAL HIGH (ref 1.7–7.7)
Neutrophils Relative %: 79 %
Platelets: 365 10*3/uL (ref 150–400)
RBC: 4.73 MIL/uL (ref 4.22–5.81)
RDW: 13.8 % (ref 11.5–15.5)
WBC: 11.7 10*3/uL — ABNORMAL HIGH (ref 4.0–10.5)
nRBC: 0 % (ref 0.0–0.2)

## 2019-08-29 LAB — COMPREHENSIVE METABOLIC PANEL
ALT: 27 U/L (ref 0–44)
AST: 18 U/L (ref 15–41)
Albumin: 4 g/dL (ref 3.5–5.0)
Alkaline Phosphatase: 100 U/L (ref 38–126)
Anion gap: 12 (ref 5–15)
BUN: 11 mg/dL (ref 6–20)
CO2: 22 mmol/L (ref 22–32)
Calcium: 9.3 mg/dL (ref 8.9–10.3)
Chloride: 101 mmol/L (ref 98–111)
Creatinine, Ser: 1.09 mg/dL (ref 0.61–1.24)
GFR calc Af Amer: >60 mL/min (ref >60)
GFR calc non Af Amer: >60 mL/min (ref >60)
Glucose, Bld: 94 mg/dL (ref 70–99)
Potassium: 3.8 mmol/L (ref 3.5–5.1)
Sodium: 135 mmol/L (ref 135–145)
Total Bilirubin: 0.6 mg/dL (ref 0.3–1.2)
Total Protein: 7.5 g/dL (ref 6.5–8.1)

## 2019-08-29 LAB — URINALYSIS, ROUTINE W REFLEX MICROSCOPIC
Bacteria, UA: NONE SEEN
Bilirubin Urine: NEGATIVE
Glucose, UA: NEGATIVE mg/dL
Ketones, ur: NEGATIVE mg/dL
Leukocytes,Ua: NEGATIVE
Nitrite: NEGATIVE
Protein, ur: NEGATIVE mg/dL
Specific Gravity, Urine: >1.046 — ABNORMAL HIGH (ref 1.005–1.030)
pH: 5 (ref 5.0–8.0)

## 2019-08-29 LAB — LIPASE, BLOOD: Lipase: 26 U/L (ref 11–51)

## 2019-08-29 MED ORDER — ONDANSETRON HCL 4 MG/2ML IJ SOLN
4.0000 mg | Freq: Once | INTRAMUSCULAR | Status: AC
  Administered 2019-08-29: 4 mg via INTRAVENOUS
  Filled 2019-08-29: qty 2

## 2019-08-29 MED ORDER — IOHEXOL 300 MG/ML  SOLN
100.0000 mL | Freq: Once | INTRAMUSCULAR | Status: AC | PRN
  Administered 2019-08-29: 100 mL via INTRAVENOUS

## 2019-08-29 MED ORDER — PREDNISONE 20 MG PO TABS
40.0000 mg | ORAL_TABLET | Freq: Every day | ORAL | 0 refills | Status: AC
Start: 2019-08-29 — End: 2019-09-05

## 2019-08-29 MED ORDER — MORPHINE SULFATE (PF) 4 MG/ML IV SOLN
4.0000 mg | Freq: Once | INTRAVENOUS | Status: AC
  Administered 2019-08-29: 4 mg via INTRAVENOUS
  Filled 2019-08-29: qty 1

## 2019-08-29 MED ORDER — AMOXICILLIN-POT CLAVULANATE 875-125 MG PO TABS
1.0000 | ORAL_TABLET | Freq: Two times a day (BID) | ORAL | 0 refills | Status: DC
Start: 2019-08-29 — End: 2020-01-11

## 2019-08-29 MED ORDER — PROMETHAZINE HCL 25 MG PO TABS
25.0000 mg | ORAL_TABLET | Freq: Four times a day (QID) | ORAL | 0 refills | Status: DC | PRN
Start: 2019-08-29 — End: 2020-09-28

## 2019-08-29 MED ORDER — SODIUM CHLORIDE 0.9 % IV BOLUS
1000.0000 mL | Freq: Once | INTRAVENOUS | Status: AC
  Administered 2019-08-29: 1000 mL via INTRAVENOUS

## 2019-08-29 MED ORDER — PREDNISONE 20 MG PO TABS
40.0000 mg | ORAL_TABLET | Freq: Once | ORAL | Status: AC
  Administered 2019-08-29: 40 mg via ORAL
  Filled 2019-08-29: qty 2

## 2019-08-29 MED ORDER — HYDROMORPHONE HCL 1 MG/ML IJ SOLN
1.0000 mg | Freq: Once | INTRAMUSCULAR | Status: AC
  Administered 2019-08-29: 1 mg via INTRAVENOUS
  Filled 2019-08-29: qty 1

## 2019-08-29 NOTE — ED Provider Notes (Signed)
[facility] Provider Note   CSN: [id number] Arrival date & time: 08/29/19  1546    History Chief Complaint  Patient presents with  . Abdominal Pain    Gregory Clarke is a 24 y.o. male with past medical history significant for anemia, ulcerative colitis who presents for evaluation abdominal pain.  Patient with periumbilical abdominal pain x1 week.  Recently diagnosed ulcerative colitis a few months ago.  Was tapered off of prednisone.  He has had constant bright red blood per rectum since he was diagnosed with ulcerative colitis.  Sees Dr. Laural Golden.  States his pain is a constant 2/10 to his abdomen however will have increased pain to a 9/10 which is intermittent.  Pain does not radiate.  Has had one episode of NBNB emesis.  Persistent nausea.  Denies fever, chills, chest pain, shortness of breath, dysuria.  Admits to some generalized weakness.  Denies additional aggravating or alleviating factors.  Denies testicular swelling, redness.  History obtained from patient and past medical records.  No interpreter used.  HPI     Past Medical History:  Diagnosis Date  . Anemia   . Asthma   . History of kidney stones   . Kidney stones 03/19/2019  . Ulcerative colitis [facility])     Patient Active Problem List   Diagnosis Date Noted  . Ulcerative colitis ([place]) 07/23/2019  . Bronchial asthma 07/23/2019  . Mesenteric lymphadenopathy 03/19/2019  . Iron deficiency anemia due to chronic blood loss 03/17/2019  . IDA (iron deficiency anemia) 03/16/2019  . Bloody diarrhea 03/16/2019  . Foot fracture 09/09/2014    Past Surgical History:  Procedure Laterality Date  . BIOPSY  03/31/2019   Procedure: BIOPSY;  Surgeon: Rogene Houston, MD;  Location: [facility];  Service: Endoscopy;;  . COLONOSCOPY WITH PROPOFOL N/A 03/31/2019   Procedure: COLONOSCOPY WITH PROPOFOL;  Surgeon: Rogene Houston, MD;  Location: [facility] ENDO SUITE;  Service: Endoscopy;  Laterality: N/A;  7:30  .  TONSILLECTOMY         Family History  Problem Relation Age of Onset  . Healthy Mother   . Healthy Father   . Healthy Sister   . Healthy Brother     Social History   Tobacco Use  . Smoking status: Never Smoker  . Smokeless tobacco: Never Used  Substance Use Topics  . Alcohol use: No  . Drug use: Yes    Types: Marijuana    Comment: 03/26/19    Home Medications Prior to Admission medications   Medication Sig Start Date End Date Taking? Authorizing Provider  albuterol (VENTOLIN HFA) 108 (90 Base) MCG/ACT inhaler Inhale 2 puffs into the lungs every 6 (six) hours as needed for wheezing. 07/23/19  Yes Rehman, Mechele Dawley, MD  Cholecalciferol (VITAMIN D) 125 MCG (5000 UT) CAPS Take 5,000 Units/day by mouth daily. 03/31/19  Yes Rehman, Mechele Dawley, MD  ferrous sulfate 325 (65 FE) MG tablet Take 1 tablet (325 mg total) by mouth daily. 03/11/19  Yes Corena Herter, PA-C  folic acid (FOLVITE) 1 MG tablet Take 1 tablet (1 mg total) by mouth daily. 07/23/19  Yes Rehman, Mechele Dawley, MD  sulfaSALAzine (AZULFIDINE) 500 MG EC tablet Take 2 tablets (1,000 mg total) by mouth 2 (two) times daily. 07/23/19  Yes Rehman, Mechele Dawley, MD  amoxicillin-clavulanate (AUGMENTIN) 875-125 MG tablet Take 1 tablet by mouth every 12 (twelve) hours. 08/29/19   Samatha Anspach A, PA-C  predniSONE (DELTASONE) 20 MG tablet Take 2 tablets (40  mg total) by mouth daily for 7 days. 08/29/19 09/05/19  Erica Richwine A, PA-C  promethazine (PHENERGAN) 25 MG tablet Take 1 tablet (25 mg total) by mouth every 6 (six) hours as needed for nausea or vomiting. 08/29/19   Sindy Mccune A, PA-C    Allergies    Patient has no known allergies.  Review of Systems   Review of Systems  Constitutional: Negative.   HENT: Negative.   Respiratory: Negative.   Cardiovascular: Negative.   Gastrointestinal: Positive for abdominal pain, blood in stool, diarrhea, nausea and vomiting. Negative for abdominal distention, anal bleeding, constipation and  rectal pain.  Genitourinary: Negative.   Musculoskeletal: Negative.   Skin: Negative.   Neurological: Negative.   All other systems reviewed and are negative.   Physical Exam Updated Vital Signs BP 114/74   Pulse 85   Temp 98.6 F (37 C) (Oral)   Resp 15   Ht 6' 2"  (1.88 m)   Wt 115.7 kg   SpO2 97%   BMI 32.74 kg/m   Physical Exam Vitals and nursing note reviewed.  Constitutional:      General: He is not in acute distress.    Appearance: He is well-developed. He is not ill-appearing, toxic-appearing or diaphoretic.  HENT:     Head: Normocephalic and atraumatic.     Mouth/Throat:     Mouth: Mucous membranes are moist.  Eyes:     Pupils: Pupils are equal, round, and reactive to light.  Cardiovascular:     Rate and Rhythm: Normal rate and regular rhythm.     Heart sounds: Normal heart sounds.  Pulmonary:     Effort: Pulmonary effort is normal. No respiratory distress.     Breath sounds: Normal breath sounds.  Abdominal:     General: Bowel sounds are normal. There is no distension.     Palpations: Abdomen is soft.     Tenderness: There is abdominal tenderness in the periumbilical area. There is no right CVA tenderness or guarding. Negative signs include Murphy's sign and McBurney's sign.     Hernia: No hernia is present.  Genitourinary:    Comments: Declines occult exam Musculoskeletal:        General: Normal range of motion.     Cervical back: Normal range of motion and neck supple.  Skin:    General: Skin is warm and dry.     Capillary Refill: Capillary refill takes less than 2 seconds.  Neurological:     General: No focal deficit present.     Mental Status: He is alert.     ED Results / Procedures / Treatments   Labs (all labs ordered are listed, but only abnormal results are displayed) Labs Reviewed  CBC WITH DIFFERENTIAL/PLATELET - Abnormal; Notable for the following components:      Result Value   WBC 11.7 (*)    Neutro Abs 9.2 (*)    All other  components within normal limits  URINALYSIS, ROUTINE W REFLEX MICROSCOPIC - Abnormal; Notable for the following components:   Color, Urine AMBER (*)    Specific Gravity, Urine >1.046 (*)    Hgb urine dipstick SMALL (*)    All other components within normal limits  COMPREHENSIVE METABOLIC PANEL  LIPASE, BLOOD    EKG None  Radiology CT Abdomen Pelvis W Contrast  Result Date: 08/29/2019 CLINICAL DATA:  Mid to lower abdominal pain with nausea, vomiting and diarrhea x1 week. EXAM: CT ABDOMEN AND PELVIS WITH CONTRAST TECHNIQUE: Multidetector CT imaging of the  abdomen and pelvis was performed using the standard protocol following bolus administration of intravenous contrast. CONTRAST:  142m OMNIPAQUE IOHEXOL 300 MG/ML  SOLN COMPARISON:  March 11, 2019 FINDINGS: Lower chest: No acute abnormality. Hepatobiliary: No focal liver abnormality is seen. No gallstones, gallbladder wall thickening, or biliary dilatation. Pancreas: Unremarkable. No pancreatic ductal dilatation or surrounding inflammatory changes. Spleen: Normal in size without focal abnormality. Adrenals/Urinary Tract: Adrenal glands are unremarkable. Kidneys are normal, without renal calculi, focal lesion, or hydronephrosis. Bladder is unremarkable. Stomach/Bowel: Stomach is within normal limits. Appendix appears normal. No evidence of bowel dilatation. A moderately thickened loop of small bowel is seen within the mid to lower pelvis, along the midline. A mild amount of Peri intestinal inflammatory fat stranding is also seen. Vascular/Lymphatic: No significant vascular findings are present. Multiple stable subcentimeter mesenteric lymph nodes are seen within the left upper quadrant. Reproductive: Prostate is unremarkable. Other: No abdominal wall hernia or abnormality. No abdominopelvic ascites. Musculoskeletal: No acute or significant osseous findings. IMPRESSION: Findings consistent with an infectious or inflammatory enteritis involving short  segment of proximal to mid ileum. Electronically Signed   By: TVirgina NorfolkM.D.   On: 08/29/2019 18:14    Procedures Procedures (including critical care time)  Medications Ordered in ED Medications  sodium chloride 0.9 % bolus 1,000 mL (0 mLs Intravenous Stopped 08/29/19 1814)  ondansetron (ZOFRAN) injection 4 mg (4 mg Intravenous Given 08/29/19 1648)  morphine 4 MG/ML injection 4 mg (4 mg Intravenous Given 08/29/19 1648)  iohexol (OMNIPAQUE) 300 MG/ML solution 100 mL (100 mLs Intravenous Contrast Given 08/29/19 1753)  HYDROmorphone (DILAUDID) injection 1 mg (1 mg Intravenous Given 08/29/19 1911)  ondansetron (ZOFRAN) injection 4 mg (4 mg Intravenous Given 08/29/19 1911)  predniSONE (DELTASONE) tablet 40 mg (40 mg Oral Given 08/29/19 1910)    ED Course  I have reviewed the triage vital signs and the nursing notes.  Pertinent labs & imaging results that were available during my care of the patient were reviewed by me and considered in my medical decision making (see chart for details).  24year old male known ulcerative colitis presents for evaluation of abdominal pain.  Recently tapered off steroids.  Pain began when he was completely off the steroids.  Has had intermittent nausea and vomiting since he was diagnosed with ulcerative colitis.  Afebrile, nonseptic, not ill-appearing.  Has had chronic bright red blood per rectum over the last year.  No weakness or lightheadedness.  Generalized tenderness to abdomen however worse at periumbilical region.  Negative McBurney point.  No testicular pain, redness or swelling.  Plan on labs, imaging and reassess  Labs and imaging personally reviewed and interpreted: CBC with mild leukocytosis at 196.7Metabolic panel without electrolyte, renal abnormality Lipase 26 Urine negative for infection CT scan with possible infectious versus inflammatory colitis.  Patient reassessed.  Pain improved however still present will give additional  medication.  Patient reassessed.  Has no current pain.  Tolerating p.o. intake without difficulty.  Will treat for inflammatory colitis given history of ulcerative colitis.  We will have him follow-up with GI closely.  He will return for any worsening symptoms.  Patient is nontoxic, nonseptic appearing, in no apparent distress.  Patient's pain and other symptoms adequately managed in emergency department.  Fluid bolus given.  Labs, imaging and vitals reviewed.  Patient does not meet the SIRS or Sepsis criteria.  On repeat exam patient does not have a surgical abdomin and there are no peritoneal signs.  No indication of appendicitis,  bowel obstruction, bowel perforation, cholecystitis, diverticulitis.    The patient has been appropriately medically screened and/or stabilized in the ED. I have low suspicion for any other emergent medical condition which would require further screening, evaluation or treatment in the ED or require inpatient management.  Patient is hemodynamically stable and in no acute distress.  Patient able to ambulate in department prior to ED.  Evaluation does not show acute pathology that would require ongoing or additional emergent interventions while in the emergency department or further inpatient treatment.  I have discussed the diagnosis with the patient and answered all questions.  Pain is been managed while in the emergency department and patient has no further complaints prior to discharge.  Patient is comfortable with plan discussed in room and is stable for discharge at this time.  I have discussed strict return precautions for returning to the emergency department.  Patient was encouraged to follow-up with PCP/specialist refer to at discharge.    MDM Rules/Calculators/A&P                           Final Clinical Impression(s) / ED Diagnoses Final diagnoses:  Periumbilical abdominal pain  Colitis    Rx / DC Orders ED Discharge Orders         Ordered    predniSONE  (DELTASONE) 20 MG tablet  Daily     Discontinue  Reprint     08/29/19 2108    promethazine (PHENERGAN) 25 MG tablet  Every 6 hours PRN     Discontinue  Reprint     08/29/19 2108    amoxicillin-clavulanate (AUGMENTIN) 875-125 MG tablet  Every 12 hours     Discontinue  Reprint     08/29/19 2112           Humza Tallerico A, PA-C 08/29/19 2113    Milton Ferguson, MD 08/31/19 1048

## 2019-08-29 NOTE — ED Notes (Signed)
Pt given popcycle and water to drink   When asked his preference, he reports that he would like to try both

## 2019-08-29 NOTE — ED Triage Notes (Signed)
Pt reports abd pain behind umbilicus x 1 week. Nausea and vomiting off/on x one week. Dx with ulcerative colitis 2 -3 months ago. Has been taking meds but nothing is helping. This doesn't feel like the same pain as he had at that time. Pain is ongoing but at times it is much worse, esp after eating or if he gets too hungry

## 2019-08-29 NOTE — Discharge Instructions (Signed)
Follow up with Dr. Laural Golden. Take the medications as prescribed. Return for new or worsening symptoms

## 2019-09-02 ENCOUNTER — Ambulatory Visit (INDEPENDENT_AMBULATORY_CARE_PROVIDER_SITE_OTHER): Payer: Self-pay | Admitting: Gastroenterology

## 2019-09-02 ENCOUNTER — Other Ambulatory Visit: Payer: Self-pay

## 2019-09-02 ENCOUNTER — Encounter (INDEPENDENT_AMBULATORY_CARE_PROVIDER_SITE_OTHER): Payer: Self-pay | Admitting: Gastroenterology

## 2019-09-02 VITALS — BP 119/63 | HR 101 | Temp 97.8°F | Ht 74.0 in | Wt 251.6 lb

## 2019-09-02 DIAGNOSIS — K51011 Ulcerative (chronic) pancolitis with rectal bleeding: Secondary | ICD-10-CM

## 2019-09-02 MED ORDER — SULFASALAZINE 500 MG PO TBEC: 1000.0000 mg | DELAYED_RELEASE_TABLET | Freq: Three times a day (TID) | ORAL | Status: DC

## 2019-09-02 NOTE — Patient Instructions (Signed)
I will discuss symptoms w/ Dr Laural Golden and contact you

## 2019-09-02 NOTE — Progress Notes (Signed)
Patient profile: Gregory Clarke is a 24 y.o. male seen for evaluation of hx of UC.    Last seen in clinic by Dr Laural Golden 07/23/19. PMHx UC diagnosed in 03/2019 Terminal ileum was normal, had hanges typical of ulcerative colitis with sparing of ascending colon. Hgb down to 7.5 prior to diagnosis. Patient was treated with prednisone this spring, was not able to start mesalamine given no insurance. Recently started on additional prednisone taper and sulfasalazine at time of last OV in May     History of Present Illness: Gregory Clarke is seen today for ER f/up he reports completing the prednisone approximately 2 weeks ago.  He also started sulfasalazine approximately 2 weeks ago.  He reports his bleeding continued with prednisone but his abdominal pain has significantly improved on prednisone.  Unfortunately abdominal pain worsened over this past weekend which prompted ER visit.  Progressively worsening pain gradually over 2 weeks since off steroids. Pain in bilateral lower abdomen, also had associated nausea. Usually having 2-3 bowel movements a day with bright red blood in each stool.  He was seen in the ER on 08/29/19 and started on 40 mg prednisone, Augmentin and given prescription for Phenergan.  Since ER visit He reports his pain is improved, previously was 8/10 pain and now is down to intermittently 3-4 out of 10.  He is still having urgency and blood in stools.  His nausea has resolved and he has not had to use the Phenergan in the past 48 hours.  He is not vomiting.  He tolerated a normal diet yesterday.    Wt Readings from Last 3 Encounters:  09/02/19 251 lb 9.6 oz (114.1 kg)  08/29/19 255 lb (115.7 kg)  07/23/19 252 lb 1.6 oz (114.4 kg)         Past Medical History:  Past Medical History:  Diagnosis Date  . Anemia   . Asthma   . History of kidney stones   . Kidney stones 03/19/2019  . Ulcerative colitis ([place])     Problem List: Patient Active Problem List   Diagnosis  Date Noted  . Ulcerative colitis ([place]) 07/23/2019  . Bronchial asthma 07/23/2019  . Mesenteric lymphadenopathy 03/19/2019  . Iron deficiency anemia due to chronic blood loss 03/17/2019  . IDA (iron deficiency anemia) 03/16/2019  . Bloody diarrhea 03/16/2019  . Foot fracture 09/09/2014    Past Surgical History: Past Surgical History:  Procedure Laterality Date  . BIOPSY  03/31/2019   Procedure: BIOPSY;  Surgeon: Rogene Houston, MD;  [facility];  Service: Endoscopy;;  . COLONOSCOPY WITH PROPOFOL N/A 03/31/2019   Procedure: COLONOSCOPY WITH PROPOFOL;  Surgeon: Rogene Houston, MD;  [facility];  Service: Endoscopy;  Laterality: N/A;  7:30  . TONSILLECTOMY      Allergies: No Known Allergies    Home Medications:  Current Outpatient Medications:  .  albuterol (VENTOLIN HFA) 108 (90 Base) MCG/ACT inhaler, Inhale 2 puffs into the lungs every 6 (six) hours as needed for wheezing., Disp: 18 g, Rfl: 2 .  amoxicillin-clavulanate (AUGMENTIN) 875-125 MG tablet, Take 1 tablet by mouth every 12 (twelve) hours., Disp: 14 tablet, Rfl: 0 .  ferrous sulfate 325 (65 FE) MG tablet, Take 1 tablet (325 mg total) by mouth daily., Disp: 30 tablet, Rfl: 0 .  folic acid (FOLVITE) 1 MG tablet, Take 1 tablet (1 mg total) by mouth daily., Disp:  , Rfl:  .  predniSONE (DELTASONE) 20 MG tablet, Take 2 tablets (40  mg total) by mouth daily for 7 days., Disp: 14 tablet, Rfl: 0 .  promethazine (PHENERGAN) 25 MG tablet, Take 1 tablet (25 mg total) by mouth every 6 (six) hours as needed for nausea or vomiting., Disp: 30 tablet, Rfl: 0 .  sulfaSALAzine (AZULFIDINE) 500 MG EC tablet, Take 2 tablets (1,000 mg total) by mouth 2 (two) times daily., Disp: 120 tablet, Rfl: 5 .  Cholecalciferol (VITAMIN D) 125 MCG (5000 UT) CAPS, Take 5,000 Units/day by mouth daily. (Patient not taking: Reported on 09/02/2019), Disp: 30 capsule, Rfl:   Current Facility-Administered Medications:  .  sulfaSALAzine  (AZULFIDINE) EC tablet 1,000 mg, 1,000 mg, Oral, TID, Ellin Mayhew, Dub Amis, PA-C   Family History: family history includes Healthy in his brother, father, mother, and sister.    Social History:   reports that he has never smoked. He has never used smokeless tobacco. He reports current drug use. Drug: Marijuana. He reports that he does not drink alcohol.   Review of Systems: Constitutional: Denies weight loss/weight gain  Eyes: No changes in vision. ENT: No oral lesions, sore throat.  GI: see HPI.  Heme/Lymph: No easy bruising.  CV: No chest pain.  GU: No hematuria.  Integumentary: No rashes.  Neuro: No headaches.  Psych: No depression/anxiety.  Endocrine: No heat/cold intolerance.  Allergic/Immunologic: No urticaria.  Resp: No cough, SOB.  Musculoskeletal: No joint swelling.    Physical Examination: BP 119/63 (BP Location: Right Arm, Patient Position: Sitting, Cuff Size: Large)   Pulse (!) 101   Temp 97.8 F (36.6 C) (Oral)   Ht 6' 2"  (1.88 m)   Wt 251 lb 9.6 oz (114.1 kg)   BMI 32.30 kg/m  Gen: NAD, alert and oriented x 4 HEENT: PEERLA, EOMI, Neck: supple, no JVD Chest: CTA bilaterally, no wheezes, crackles, or other adventitious sounds CV: RRR, no m/g/c/r Abd: soft, NT, ND, +BS in all four quadrants; no HSM, guarding, ridigity, or rebound tenderness Ext: no edema, well perfused with 2+ pulses, Skin: no rash or lesions noted on observed skin Lymph: no noted LAD  Data Reviewed:   CT a/p 08/2019-IMPRESSION: Findings consistent with an infectious or inflammatory enteritis involving short segment of proximal to mid ileum.   Labs from ER Visit - CMP normal, CBC w/ WBC 11.7, lipase normal.   Assessment/Plan: [name] is a 24 y.o. male seen for follow-up of ulcerative colitis. Recently hospitalized over weekend with flare-like symptoms. Symptomatically improving since restarted on prednisone taper (continue 41m tapering by 56meach week). Did not have stool studies  and no infection like symptoms so  discontinue Agumentin to decrease risk of C diff. Will increase sulfasalazine to 100075mID (currently on BID dosing).   F.up in office in 4 weeks when on prednisone 22m57mr further plan  Case discussed with Dr. RehmLaural Goldenatient called after with updated recommendations.  PresKush seen today for follow-up.  Diagnoses and all orders for this visit:  Ulcerative pancolitis with rectal bleeding (HCC) -     sulfaSALAzine (AZULFIDINE) EC tablet 1,000 mg      I personally performed the service, non-incident to. (WP)  JaniLaurine Blazer-[facility]

## 2019-09-21 ENCOUNTER — Other Ambulatory Visit (INDEPENDENT_AMBULATORY_CARE_PROVIDER_SITE_OTHER): Payer: Self-pay | Admitting: Internal Medicine

## 2019-09-21 NOTE — Telephone Encounter (Signed)
Patient left voice mail message regarding medication refill - please advise - [phone]

## 2019-10-08 ENCOUNTER — Ambulatory Visit (INDEPENDENT_AMBULATORY_CARE_PROVIDER_SITE_OTHER): Payer: Self-pay | Admitting: Gastroenterology

## 2019-10-08 ENCOUNTER — Encounter (INDEPENDENT_AMBULATORY_CARE_PROVIDER_SITE_OTHER): Payer: Self-pay | Admitting: Gastroenterology

## 2019-10-08 ENCOUNTER — Other Ambulatory Visit: Payer: Self-pay

## 2019-10-08 VITALS — BP 129/85 | HR 102 | Temp 98.9°F | Ht 74.0 in | Wt 254.0 lb

## 2019-10-08 DIAGNOSIS — K51011 Ulcerative (chronic) pancolitis with rectal bleeding: Secondary | ICD-10-CM

## 2019-10-08 NOTE — Progress Notes (Signed)
Patient profile: Gregory Clarke is a 24 y.o. male seen for follow up. Last seen in clinic June 23rd 2021 for ER follow-up.  At that visit his sulfasalazine was increased from 1032m BID to 10027mTID and he was maintained on prednisone with taper of 47m67m1 week.   UC diagnosed in 03/2019 Terminal ileum was normal, had hanges typical of ulcerative colitis with sparing of ascending colon. Hgb down to 7.5 prior to diagnosis. Medication teatment has been complicated by patient's lack of insurance     History of Present Illness: PreMelburn Clarke seen today for follow up. He reports occasional bleeding but no abdominal pain over past few weeks. Bleeding occurs few times a week-occurs after a BM, usually teaspoon of BRB (described as mild at this point and significantly less BRB than few months ago). Currently having 2-3 BM/day, consistency varies but usually formed.  Denies diarrhea.  Denies tenesmus.  Denies n/v. Mild GERD symptoms over past few weeks, takes Tums PRN. Trying to avoid garlic now which is helping. No dysphagia. Tolerating a normal diet.   He reports being jittery and shaky-sometimes worse than other times. Currently on prednisone 247m6mily but hasn't had any symptoms in past.   Pharmacy had trouble getting sulfasalazine and was out a week, did not notice any jittery/shakiness changed when not taking sulfasalazine..  Wt Readings from Last 3 Encounters:  10/08/19 (!) 254 lb (115.2 kg)  09/02/19 251 lb 9.6 oz (114.1 kg)  08/29/19 255 lb (115.7 kg)     Last Colonoscopy:03/2019 - The examined portion of the ileum was normal. - The ascending colon and cecum are normal. Biopsied. - Moderately active (Mayo Score 2) pancolitis ulcerative colitis, new diagnosis.  Past Medical History:  Past Medical History:  Diagnosis Date  . Anemia   . Asthma   . History of kidney stones   . Kidney stones 03/19/2019  . Ulcerative colitis ([facility])[place]  Problem List: Patient Active Problem  List   Diagnosis Date Noted  . Ulcerative colitis ([facility])[place]/13/2021  . Bronchial asthma 07/23/2019  . Mesenteric lymphadenopathy 03/19/2019  . Iron deficiency anemia due to chronic blood loss 03/17/2019  . IDA (iron deficiency anemia) 03/16/2019  . Bloody diarrhea 03/16/2019  . Foot fracture 09/09/2014    Past Surgical History: Past Surgical History:  Procedure Laterality Date  . BIOPSY  03/31/2019   Procedure: BIOPSY;  Surgeon: RehmRogene Houston;  [facility];  Service: Endoscopy;;  . COLONOSCOPY WITH PROPOFOL N/A 03/31/2019   Procedure: COLONOSCOPY WITH PROPOFOL;  Surgeon: RehmRogene Houston;  [facility];  Service: Endoscopy;  Laterality: N/A;  7:30  . TONSILLECTOMY      Allergies: No Known Allergies    Home Medications:  Current Outpatient Medications:  .  albuterol (VENTOLIN HFA) 108 (90 Base) MCG/ACT inhaler, Inhale 2 puffs into the lungs every 6 (six) hours as needed for wheezing., Disp: 18 g, Rfl: 2 .  Cholecalciferol (VITAMIN D) 125 MCG (5000 UT) CAPS, Take 5,000 Units/day by mouth daily., Disp: 30 capsule, Rfl:  .  folic acid (FOLVITE) 1 MG tablet, Take 1 tablet (1 mg total) by mouth daily., Disp:  , Rfl:  .  predniSONE (DELTASONE) 10 MG tablet, TAKE 3 TABLETS BY MOUTH ONCE DAILY WITH  BREAKFAST.  DROP  DOSE  BY  5MG  EVERY  WEEK  UNTIL  FINISHED, Disp: 100 tablet, Rfl: 0 .  sulfaSALAzine (AZULFIDINE) 500 MG EC tablet, Take 2  tablets (1,000 mg total) by mouth 2 (two) times daily., Disp: 120 tablet, Rfl: 5 .  amoxicillin-clavulanate (AUGMENTIN) 875-125 MG tablet, Take 1 tablet by mouth every 12 (twelve) hours. (Patient not taking: Reported on 10/08/2019), Disp: 14 tablet, Rfl: 0 .  ferrous sulfate 325 (65 FE) MG tablet, Take 1 tablet (325 mg total) by mouth daily. (Patient not taking: Reported on 10/08/2019), Disp: 30 tablet, Rfl: 0 .  promethazine (PHENERGAN) 25 MG tablet, Take 1 tablet (25 mg total) by mouth every 6 (six) hours as needed for nausea  or vomiting. (Patient not taking: Reported on 10/08/2019), Disp: 30 tablet, Rfl: 0  Current Facility-Administered Medications:  .  sulfaSALAzine (AZULFIDINE) EC tablet 1,000 mg, 1,000 mg, Oral, TID, Ellin Mayhew, Dub Amis, PA-C   Family History: family history includes Healthy in his brother, father, mother, and sister.    Social History:   reports that he has never smoked. He has never used smokeless tobacco. He reports current drug use. Drug: Marijuana. He reports that he does not drink alcohol.   Review of Systems: Constitutional: Denies weight loss/weight gain  Eyes: No changes in vision. ENT: No oral lesions, sore throat.  GI: see HPI.  Heme/Lymph: No easy bruising.  CV: No chest pain.  GU: No hematuria.  Integumentary: No rashes.  Neuro: No headaches.  Psych: No depression/anxiety.  Endocrine: No heat/cold intolerance.  Allergic/Immunologic: No urticaria.  Resp: No cough, SOB.  Musculoskeletal: No joint swelling.    Physical Examination: BP (!) 129/85 (BP Location: Right Arm, Patient Position: Sitting, Cuff Size: Large)   Pulse 102   Temp 98.9 F (37.2 C) (Oral)   Ht 6' 2"  (1.88 m)   Wt (!) 254 lb (115.2 kg)   BMI 32.61 kg/m  Gen: NAD, alert and oriented x 4 HEENT: PEERLA, EOMI, Neck: supple, no JVD Chest: CTA bilaterally, no wheezes, crackles, or other adventitious sounds CV: RRR, no m/g/c/r Abd: soft, NT, ND, +BS in all four quadrants; no HSM, guarding, ridigity, or rebound tenderness Ext: no edema, well perfused with 2+ pulses, Skin: no rash or lesions noted on observed skin Lymph: no noted LAD  Data Reviewed:  CT a/p 08/2019-IMPRESSION: Findings consistent with an infectious or inflammatory enteritis involving short segment of proximal to mid ileum.   Labs from ER Visit - CMP normal, CBC w/ WBC 11.7, lipase normal.    Assessment/Plan: [name] is a 24 y.o. male seen for follow-up of ulcerative colitis.  Recently had ED visit in June 2021 for  flare-like symptoms.  He was restarted on prednisone 40 mg at last visit, he is tapered down to 30 mg, he was out of sulfasalazine for a week due to issues with the pharmacy and did not taper prednisone that week.  Symptomatically he is improved with just occasional rectal bleeding at this point.  He feels symptoms are much improved. we will have him continue current sulfasalazine dose and continue decreasing prednisone to 5 mg/week.  He is to notify me if he has any flare-like symptoms with decreasing or when completes taper.  Patient has some mild jittery and shakiness over the past few weeks-does not recall ever having this with prednisone in the past.  Does not feel like it change the week he was off sulfasalazine.  To notify me if worsens or continues.     Dontel was seen today for follow-up.  Diagnoses and all orders for this visit:  Ulcerative pancolitis with rectal bleeding [facility])     Has follow-up scheduled  with Dr. Laural Golden in [place] call sooner with any issues.   I personally performed the service, non-incident to. (WP)  Laurine Blazer, [facility] for Gastrointestinal Disease

## 2019-10-08 NOTE — Patient Instructions (Signed)
Continue decreasing prednisone by 5 mg every week as discussed.  Please contact me if when you finish you see any worsening symptoms or if the jitteriness continues or worsens.

## 2019-11-09 ENCOUNTER — Inpatient Hospital Stay (HOSPITAL_COMMUNITY): Payer: Self-pay | Attending: Hematology

## 2019-11-10 ENCOUNTER — Other Ambulatory Visit: Payer: Self-pay

## 2019-11-10 ENCOUNTER — Inpatient Hospital Stay (HOSPITAL_COMMUNITY): Payer: Self-pay | Admitting: Nurse Practitioner

## 2019-11-10 NOTE — Assessment & Plan Note (Signed)
1.  Iron deficiency anemia: -Presented with bright red bleeding per rectum, diarrhea, and abdominal pain x1 year. -Labs done on 03/11/2019 showed hemoglobin 7.5, MCV 70.9, platelets 588, ferritin 9, percent saturation 2. -CT of the abdomen and pelvis on 03/11/2019 revealed lymph nodes throughout the mesentery of both small bowel and colon.  The most prominent lymph nodes are near the splenic flexure and within the mid abdomen.  The largest node in the left upper quadrant measures 12 mm.  Also of note nodule seen within the mesentery of the colon from the cecum through the sigmoid colon.  This is likely reactive from underlying inflammatory bowel disease. -Patient has underlying Crohn's disease.  Lymphadenopathy seen in the small bowel and colon is another common manifestations of the Crohn's disease. -Patient has no B symptoms.  Patient denies any joint pain.  He does have shortness of breath with activity. -He was given 2 infusions of IV iron on 03/20/2019 and 04/03/2019. -He had a colonoscopy on 03/31/2019 that showed examined portion of the ileum was normal.  The ascending colon and cecum were normal.  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 -Labs done on 07/07/2019 hemoglobin 15.4, ferritin 19, percent saturation 13, platelets 364 -He will continue taking oral tablets daily. -He reports he is having bleeding in his stool.  He has a follow-up appointment with Dr. Laural Golden on May 6 to address this.  Most likely due to his Crohn's disease.  He was having bleeding and he was put on steroids.  Once the steroids stopped the bleeding return. -He will follow-up in 4 months with labs

## 2020-01-11 ENCOUNTER — Encounter (INDEPENDENT_AMBULATORY_CARE_PROVIDER_SITE_OTHER): Payer: Self-pay

## 2020-01-11 ENCOUNTER — Encounter (INDEPENDENT_AMBULATORY_CARE_PROVIDER_SITE_OTHER): Payer: Self-pay | Admitting: Gastroenterology

## 2020-01-11 ENCOUNTER — Ambulatory Visit (INDEPENDENT_AMBULATORY_CARE_PROVIDER_SITE_OTHER): Payer: PRIVATE HEALTH INSURANCE | Admitting: Gastroenterology

## 2020-01-11 ENCOUNTER — Telehealth (INDEPENDENT_AMBULATORY_CARE_PROVIDER_SITE_OTHER): Payer: Self-pay

## 2020-01-11 ENCOUNTER — Other Ambulatory Visit: Payer: Self-pay

## 2020-01-11 VITALS — BP 121/84 | HR 111 | Temp 98.4°F | Ht 74.0 in | Wt 254.8 lb

## 2020-01-11 DIAGNOSIS — K51011 Ulcerative (chronic) pancolitis with rectal bleeding: Secondary | ICD-10-CM

## 2020-01-11 DIAGNOSIS — R21 Rash and other nonspecific skin eruption: Secondary | ICD-10-CM

## 2020-01-11 NOTE — Patient Instructions (Signed)
Stop sulfasalazine Ask PCP when can you lower the prednisone dosage Fill out form to apply to [facility] for Delzicol at lower price and bring back to [place] Call the office if you find out your pharmacy, we will send a prescription for mesalamine at that time Perform blood workup

## 2020-01-11 NOTE — Progress Notes (Signed)
Gregory Clarke, M.D. Gastroenterology & Hepatology [facility] [address] [place], [place] [address]  Primary Care Physician: Gregory Douglas, MD [address] [place] [address]  I will communicate my assessment and recommendations to the referring MD via EMR. "Note: Occasional unusual wording and randomly placed punctuation marks may result from the use of speech recognition technology to transcribe this document"  Problems: 1. Ulcerative pancolitis 2. New onset rash  History of Present Illness: Gregory Clarke is a 24 y.o. 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  The patient was last seen on 10/08/2019. At that time, the patient was counseled to increase his sulfasalazine to 1000 mg 3 times daily and to continue taper of prednisone by 5 mg every week.  The patient reports that with the sulfasalazine he had presented improvement in his symptoms and he was able to stop his prednisone completely.  However, for the last 2 months he has presented new onset of erythematous itchy rash in skin in areas of flexion in the antecubital and popliteal fossa, as well as in forehead and eyelids. The patient became very concerned as he noticed that rash spread through his body diffusely and was causing significant discomfort. Patient stopped prednisone close to 2 months ago, but due to the rash his PCP started him back on prednisone 3 weeks ago as he considered this reaction could be related to an ulcerative colitis rash or due to his medication. currently he is on prednisone 10 mg qday. The patient states that the rash went down from giant paintful and itchy scabs to an erythematous rash with the use of prednisone.  Importantly, he reports his symptoms worsened recently as he was found to have a Staph infection in his groin and was prescribed  Bactrim for 10 days. He noticed right away his rash became more pruritic.  The patient states he has had some nausea which he has been able to manage with Zofran.  He reports that he has seen less blood in his bowel movements which happens maybe once or twice a week and is very scant in amount.  He is having 2 soft to solid bowel movements per day which is better compared to prior.  He has had very occasional episodes of abdominal pain (believes only 2 times) that are transient and go away on its own.  Denies vomiting, fever, chills, melena, hematemesis, abdominal distention, diarrhea, jaundice, pruritus or weight loss.  The patient currently states that he obtained some medical insurance coverage but every time they look his insurance in the pharmacy they can not identify his affiliation which has affected his medication coverage.  Last flu shot: has not had it this year Last evaluation by dermatology: never Last DEXA scan: never COVID-19 shot: has not had vaccine yet  FHx: neg for any gastrointestinal/liver disease, breast cancer great aunt Social: neg smoking, alcohol. Smokes marihuana intermittently Surgical: non contributory  Past Medical History: Past Medical History:  Diagnosis Date  . Anemia   . Asthma   . History of kidney stones   . Kidney stones 03/19/2019  . Ulcerative colitis [facility])     Past Surgical History: Past Surgical History:  Procedure Laterality Date  . BIOPSY  03/31/2019   Procedure: BIOPSY;  Surgeon: Rogene Houston, MD;  [facility];  Service: Endoscopy;;  . COLONOSCOPY WITH PROPOFOL N/A 03/31/2019  Procedure: COLONOSCOPY WITH PROPOFOL;  Surgeon: Rogene Houston, MD;  [facility];  Service: Endoscopy;  Laterality: N/A;  7:30  . TONSILLECTOMY      Family History: Family History  Problem Relation Age of Onset  . Healthy Mother   . Healthy Father   . Healthy Sister   . Healthy Brother     Social History: Social History    Tobacco Use  Smoking Status Never Smoker  Smokeless Tobacco Never Used   Social History   Substance and Sexual Activity  Alcohol Use No   Social History   Substance and Sexual Activity  Drug Use Yes  . Types: Marijuana   Comment: 03/26/19    Allergies: No Known Allergies  Medications: Current Outpatient Medications  Medication Sig Dispense Refill  . albuterol (VENTOLIN HFA) 108 (90 Base) MCG/ACT inhaler Inhale 2 puffs into the lungs every 6 (six) hours as needed for wheezing. 18 g 2  . amoxicillin-clavulanate (AUGMENTIN) 875-125 MG tablet Take 1 tablet by mouth every 12 (twelve) hours. (Patient not taking: Reported on 10/08/2019) 14 tablet 0  . Cholecalciferol (VITAMIN D) 125 MCG (5000 UT) CAPS Take 5,000 Units/day by mouth daily. 30 capsule   . ferrous sulfate 325 (65 FE) MG tablet Take 1 tablet (325 mg total) by mouth daily. (Patient not taking: Reported on 10/08/2019) 30 tablet 0  . folic acid (FOLVITE) 1 MG tablet Take 1 tablet (1 mg total) by mouth daily.    . predniSONE (DELTASONE) 10 MG tablet TAKE 3 TABLETS BY MOUTH ONCE DAILY WITH  BREAKFAST.  DROP  DOSE  BY  5MG  EVERY  WEEK  UNTIL  FINISHED 100 tablet 0  . promethazine (PHENERGAN) 25 MG tablet Take 1 tablet (25 mg total) by mouth every 6 (six) hours as needed for nausea or vomiting. (Patient not taking: Reported on 10/08/2019) 30 tablet 0  . sulfaSALAzine (AZULFIDINE) 500 MG EC tablet Take 2 tablets (1,000 mg total) by mouth 2 (two) times daily. 120 tablet 5   Current Facility-Administered Medications  Medication Dose Route Frequency Provider Last Rate Last Admin  . sulfaSALAzine (AZULFIDINE) EC tablet 1,000 mg  1,000 mg Oral TID Minus Liberty, PA-C        Review of Systems: GENERAL: negative for malaise, night sweats HEENT: No changes in hearing or vision, no nose bleeds or other nasal problems. NECK: Negative for lumps, goiter, pain and significant neck swelling RESPIRATORY: Negative for cough,  wheezing CARDIOVASCULAR: Negative for chest pain, leg swelling, palpitations, orthopnea GI: SEE HPI MUSCULOSKELETAL: Negative for joint pain or swelling, back pain, and muscle pain. SKIN: Negative for lesions, rash PSYCH: Negative for sleep disturbance, mood disorder and recent psychosocial stressors. HEMATOLOGY Negative for prolonged bleeding, bruising easily, and swollen nodes. ENDOCRINE: Negative for cold or heat intolerance, polyuria, polydipsia and goiter. NEURO: negative for tremor, gait imbalance, syncope and seizures. The remainder of the review of systems is noncontributory.   Physical Exam: BP 121/84 (BP Location: Right Arm, Patient Position: Sitting, Cuff Size: Normal)   Pulse (!) 111   Temp 98.4 F (36.9 C) (Oral)   Ht 6' 2"  (1.88 m)   Wt 254 lb 12.8 oz (115.6 kg)   BMI 32.71 kg/m  GENERAL: The patient is AO x3, in no acute distress. HEENT: Head is normocephalic and atraumatic. EOMI are intact. Mouth is well hydrated and without lesions. NECK: Supple. No masses LUNGS: Clear to auscultation. No presence of rhonchi/wheezing/rales. Adequate chest expansion HEART: RRR, normal s1 and  s2. ABDOMEN: Soft, nontender, no guarding, no peritoneal signs, and nondistended. BS +. No masses. EXTREMITIES: Without any cyanosis, clubbing, rash, lesions or edema. NEUROLOGIC: AOx3, no focal motor deficit. SKIN: no jaundice.  Has presence of slight scaly erythematous rash in his forehead, eyebrows and eyelids, has linear erythema in his antecubital fossa and popliteal fossa  Imaging/Labs: as above  I personally reviewed and interpreted the available labs, imaging and endoscopic files.  Impression and Plan: Tillman Kazmierski is a 24 y.o. 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  The patient has presented improvement of his ulcerative colitis while on sulfasalazine and he was able to stop  his prednisone with adequate tolerance and clinical improvement, although he never reached clinical remission.  Nevertheless, it seems that he has developed an allergic reaction to sulfasalazine, it is likely that this is a reaction to sulfa drugs as he developed worsening symptoms when taking Bactrim recently.  Due to this, I advised the patient to stop taking the sulfasalazine for now.  However, he will need a long-term medication to control his ulcerative colitis.  He will benefit from mesalamine compound but the main limitation is financial.  We will request Delzicol or Lialda through [employer] I determine his eligibility.  Once he is able to start his medication he needs to discuss with his primary care physician if he can stop his prednisone as this may bring long-term side effects for him.  We will check a CBC and CMP right now.  - Stop sulfasalazine - Ask PCP when can to lower the prednisone , ideally once taking mesalamine compounds - Patient to bring form to apply to [facility] for Delzicol or Lialda  - Patient to get vaccinated for flu and COVID-19 - RTC 8-10 weeks  All questions were answered.      Harvel Quale, MD Gastroenterology and Hepatology [facility]

## 2020-01-12 NOTE — Telephone Encounter (Signed)
LeighAnn Chasitee Zenker, CMA  

## 2020-01-19 NOTE — Telephone Encounter (Signed)
Patient brought patient assistance forms back with his income tax forms. I have faxed the forms to the patient assistance foundation [employer] at [phone].

## 2020-01-19 NOTE — Telephone Encounter (Signed)
I called the patient to follow up on his application. He states he is having his girlfriend bring the forms by the office today 01/19/2020.

## 2020-01-20 NOTE — Telephone Encounter (Signed)
Patient aware he was approved for the Pap through [employer]. He is aware they will be contacting him to arrange shipment.

## 2020-01-25 ENCOUNTER — Other Ambulatory Visit (INDEPENDENT_AMBULATORY_CARE_PROVIDER_SITE_OTHER): Payer: Self-pay | Admitting: Internal Medicine

## 2020-02-02 ENCOUNTER — Ambulatory Visit (INDEPENDENT_AMBULATORY_CARE_PROVIDER_SITE_OTHER): Payer: Self-pay | Admitting: Internal Medicine

## 2020-03-10 ENCOUNTER — Other Ambulatory Visit (INDEPENDENT_AMBULATORY_CARE_PROVIDER_SITE_OTHER): Payer: Self-pay | Admitting: Internal Medicine

## 2020-03-21 ENCOUNTER — Telehealth (INDEPENDENT_AMBULATORY_CARE_PROVIDER_SITE_OTHER): Payer: PRIVATE HEALTH INSURANCE | Admitting: Gastroenterology

## 2020-03-21 ENCOUNTER — Encounter (INDEPENDENT_AMBULATORY_CARE_PROVIDER_SITE_OTHER): Payer: Self-pay | Admitting: Gastroenterology

## 2020-03-21 ENCOUNTER — Other Ambulatory Visit: Payer: Self-pay

## 2020-03-21 VITALS — Ht 74.0 in | Wt 250.0 lb

## 2020-03-21 DIAGNOSIS — K51011 Ulcerative (chronic) pancolitis with rectal bleeding: Secondary | ICD-10-CM

## 2020-03-21 NOTE — Patient Instructions (Addendum)
Perform blood workup Perform stool workup Continue Lialda 2.4 g qday Discuss with PCP flu shot, COVID shot (once recovered) and referral to dermatology for skin cancer screening

## 2020-03-21 NOTE — Progress Notes (Signed)
Gregory Clarke, M.D. Gastroenterology & Hepatology [facility] [address] [place], [place] [address] Primary Care Physician: Leonie Douglas, MD [address] [place] [address]  This is a virtual video visit.  It required patient-provider interaction for the medical decision making as documented below. The patient has consented and agreed to proceed with a Telehealth encounter given the current Coronavirus pandemic.  VIRTUAL VISIT NOTE Patient location: Home Provider location: Office  I will communicate my assessment and recommendations to the referring MD via EMR. Note: Occasional unusual wording and randomly placed punctuation marks may result from the use of speech recognition technology to transcribe this document"  Problems: 1. Ulcerative pancolitis  History of Present Illness: Gregory Clarke is a 25 y.o. male with past medical history of ulcerative pancolitis, who presents for surveillance of his ulcerative colitis.  The patient was last seen on 01/11/2020.  At that time the patient was switched to Lialda through [facility] program as it was considered that he was presenting a skin reaction due to sulfa in sulfasalazine.  He was also advised to get vaccinated for the flu and COVID-19.  However, the patient did not get vaccinated.  In fact, the patient requested a virtual appointment as he recently contracted COVID-19 infection.  He has not required to go to the ER hospital due to this.  Patient reports that he has presented improvement of his bowel movements with Lialda 2.4 g qday. He states very occasionally has seen some blood in his stools but this is very infrequent. He has more formed bowel movements, has 1-2 BMs per day. The patient denies having any nausea, vomiting, fever, chills, hematochezia, melena, hematemesis, abdominal distention, abdominal pain, diarrhea, jaundice, pruritus or weight loss.  Overall feels  better than when he was taking the sulfasalazine. States that he is still presenting rash in his skin, which is milder in nature than before but is still causing itching. Has not been on prednisone for a month. He is trying to schedule an appointment with an allergy doctor to evaluate this further.  Last Colonoscopy: 03/2019 - [facility] 2 score from rectum to hepatic flexure.  Last flu shot: has not had it this year Last evaluation by dermatology: never COVID-19 shot: has not had vaccine yet  Past Medical History: Past Medical History:  Diagnosis Date  . Anemia   . Asthma   . History of kidney stones   . Kidney stones 03/19/2019  . Ulcerative colitis [facility])     Past Surgical History: Past Surgical History:  Procedure Laterality Date  . BIOPSY  03/31/2019   Procedure: BIOPSY;  Surgeon: Rogene Houston, MD;  [facility];  Service: Endoscopy;;  . COLONOSCOPY WITH PROPOFOL N/A 03/31/2019   Procedure: COLONOSCOPY WITH PROPOFOL;  Surgeon: Rogene Houston, MD;  [facility];  Service: Endoscopy;  Laterality: N/A;  7:30  . TONSILLECTOMY      Family History: Family History  Problem Relation Age of Onset  . Healthy Mother   . Healthy Father   . Healthy Sister   . Healthy Brother     Social History: Social History   Tobacco Use  Smoking Status Never Smoker  Smokeless Tobacco Never Used   Social History   Substance and Sexual Activity  Alcohol Use No   Social History   Substance and Sexual Activity  Drug Use Yes  . Types: Marijuana   Comment: 03/26/19    Allergies: No Known Allergies  Medications: Current Outpatient Medications  Medication Sig Dispense Refill  . albuterol (VENTOLIN HFA) 108 (90 Base) MCG/ACT inhaler INHALE 2 PUFFS BY MOUTH EVERY 6 HOURS AS NEEDED FOR WHEEZING 18 g 0  . Cholecalciferol (VITAMIN D) 125 MCG (5000 UT) CAPS Take 5,000 Units/day by mouth daily. 30 capsule   . mesalamine (LIALDA) 1.2 g EC tablet Take 1.2 g by mouth in the  morning and at bedtime.    . promethazine (PHENERGAN) 25 MG tablet Take 1 tablet (25 mg total) by mouth every 6 (six) hours as needed for nausea or vomiting. 30 tablet 0  . folic acid (FOLVITE) 1 MG tablet Take 1 tablet (1 mg total) by mouth daily. (Patient not taking: Reported on 03/21/2020)    . predniSONE (DELTASONE) 10 MG tablet TAKE 3 TABLETS BY MOUTH ONCE DAILY WITH  BREAKFAST.  DROP  DOSE  BY  5MG  EVERY  WEEK  UNTIL  FINISHED (Patient not taking: Reported on 03/21/2020) 100 tablet 0   Current Facility-Administered Medications  Medication Dose Route Frequency Provider Last Rate Last Admin  . sulfaSALAzine (AZULFIDINE) EC tablet 1,000 mg  1,000 mg Oral TID Ezzard Standing, PA-C        Review of Systems: GENERAL: negative for malaise, night sweats HEENT: No changes in hearing or vision, no nose bleeds or other nasal problems. NECK: Negative for lumps, goiter, pain and significant neck swelling RESPIRATORY: Negative for cough, wheezing CARDIOVASCULAR: Negative for chest pain, leg swelling, palpitations, orthopnea GI: SEE HPI MUSCULOSKELETAL: Negative for joint pain or swelling, back pain, and muscle pain. SKIN: Negative for lesions, rash PSYCH: Negative for sleep disturbance, mood disorder and recent psychosocial stressors. HEMATOLOGY Negative for prolonged bleeding, bruising easily, and swollen nodes. ENDOCRINE: Negative for cold or heat intolerance, polyuria, polydipsia and goiter. NEURO: negative for tremor, gait imbalance, syncope and seizures. The remainder of the review of systems is noncontributory.   Physical Exam: GENERAL: The patient is AO x3, in no acute distress. Obese. HEENT: Head is normocephalic and atraumatic. EOMI are intact. Mouth is well hydrated and without lesions. LUNGS: Adequate chest expansion. Auscultation could not be performed remotely. HEART: Auscultation could not be performed remotely. ABDOMEN: Nondistended. No masses were observed. EXTREMITIES:  Without any cyanosis, clubbing, rash, lesions or edema. NEUROLOGIC: AOx3, no focal motor deficit. SKIN: no jaundice, no rashes  Imaging/Labs: as above  I personally reviewed and interpreted the available labs, imaging and endoscopic files.  Impression and Plan: Binnie Vonderhaar is a 25 y.o. male with past medical history of ulcerative pancolitis, who presents for surveillance of his ulcerative colitis.  The patient has presented improvement of his symptoms and he is close to reach clinical remission.  Unfortunately, he recently contracted COVID-19 infection.  Fortunately, he has not presented any moderate or severe presentation of the disease.  I recommended him to get vaccinated against the flu and COVID-19 once he is eligible, which he understood and agreed.  Given his improvement with the current dose of Lialda, we will continue at the current dose.  Will check fecal calprotectin and blood work-up (to perform in 1 month).  Finally, he will need to follow-up with the allergy doctor regarding his skin rash, but also I counseled him to make an appointment with dermatologist for this purpose and to have skin cancer screening.  - Check CBC , CMP and vitamin D in 1 month - Check fecal calprotectin  in 1 month - Continue Lialda 2.4 g qday - Discuss with PCP flu shot,  COVID shot (once recovered) and referral to dermatology for skin cancer screening  All questions were answered.      Face-to-face time: I spent a total of 30 minutes  Gregory Peppers, MD Gastroenterology and Hepatology [facility]

## 2020-03-24 NOTE — Progress Notes (Signed)
..  The following Assist/Replace Program for Fera[employer] from [employer] has been terminated due to program expired 03/18/2020, and patient now has commerical insurance as of 01/11/2020.  Last DOS:04/03/2019

## 2020-04-25 ENCOUNTER — Other Ambulatory Visit (INDEPENDENT_AMBULATORY_CARE_PROVIDER_SITE_OTHER): Payer: Self-pay | Admitting: Internal Medicine

## 2020-04-25 NOTE — Telephone Encounter (Signed)
This is a medication prescribed his PCP, not a GI related medication. He needs to reach his PCP for further refills.

## 2020-06-21 ENCOUNTER — Emergency Department (HOSPITAL_COMMUNITY)
Admission: EM | Admit: 2020-06-21 | Discharge: 2020-06-21 | Disposition: A | Discharge status: Home / Self Care | Payer: Self-pay | Attending: Emergency Medicine | Admitting: Emergency Medicine

## 2020-06-21 ENCOUNTER — Other Ambulatory Visit: Payer: Self-pay

## 2020-06-21 ENCOUNTER — Encounter (HOSPITAL_COMMUNITY): Payer: Self-pay | Admitting: Emergency Medicine

## 2020-06-21 ENCOUNTER — Emergency Department (HOSPITAL_COMMUNITY): Payer: Self-pay

## 2020-06-21 DIAGNOSIS — N200 Calculus of kidney: Secondary | ICD-10-CM | POA: Insufficient documentation

## 2020-06-21 DIAGNOSIS — J45909 Unspecified asthma, uncomplicated: Secondary | ICD-10-CM | POA: Insufficient documentation

## 2020-06-21 LAB — CBC WITH DIFFERENTIAL/PLATELET
Abs Immature Granulocytes: 0.02 10*3/uL (ref 0.00–0.07)
Basophils Absolute: 0.1 10*3/uL (ref 0.0–0.1)
Basophils Relative: 1 %
Eosinophils Absolute: 0.6 10*3/uL — ABNORMAL HIGH (ref 0.0–0.5)
Eosinophils Relative: 6 %
HCT: 43.2 % (ref 39.0–52.0)
Hemoglobin: 15 g/dL (ref 13.0–17.0)
Immature Granulocytes: 0 %
Lymphocytes Relative: 21 %
Lymphs Abs: 2.3 10*3/uL (ref 0.7–4.0)
MCH: 32 pg (ref 26.0–34.0)
MCHC: 34.7 g/dL (ref 30.0–36.0)
MCV: 92.1 fL (ref 80.0–100.0)
Monocytes Absolute: 1 10*3/uL (ref 0.1–1.0)
Monocytes Relative: 10 %
Neutro Abs: 6.8 10*3/uL (ref 1.7–7.7)
Neutrophils Relative %: 62 %
Platelets: 307 10*3/uL (ref 150–400)
RBC: 4.69 MIL/uL (ref 4.22–5.81)
RDW: 12.8 % (ref 11.5–15.5)
WBC: 10.8 10*3/uL — ABNORMAL HIGH (ref 4.0–10.5)
nRBC: 0 % (ref 0.0–0.2)

## 2020-06-21 LAB — BASIC METABOLIC PANEL
Anion gap: 11 (ref 5–15)
BUN: 12 mg/dL (ref 6–20)
CO2: 24 mmol/L (ref 22–32)
Calcium: 9.2 mg/dL (ref 8.9–10.3)
Chloride: 101 mmol/L (ref 98–111)
Creatinine, Ser: 1.12 mg/dL (ref 0.61–1.24)
GFR, Estimated: >60 mL/min (ref >60)
Glucose, Bld: 125 mg/dL — ABNORMAL HIGH (ref 70–99)
Potassium: 4.1 mmol/L (ref 3.5–5.1)
Sodium: 136 mmol/L (ref 135–145)

## 2020-06-21 LAB — URINALYSIS, ROUTINE W REFLEX MICROSCOPIC
Bilirubin Urine: NEGATIVE
Glucose, UA: NEGATIVE mg/dL
Ketones, ur: NEGATIVE mg/dL
Leukocytes,Ua: NEGATIVE
Nitrite: NEGATIVE
Protein, ur: 100 mg/dL — AB
RBC / HPF: >50 RBC/hpf — ABNORMAL HIGH (ref 0–5)
Specific Gravity, Urine: 1.03 (ref 1.005–1.030)
pH: 5 (ref 5.0–8.0)

## 2020-06-21 MED ORDER — ONDANSETRON 8 MG PO TBDP
8.0000 mg | ORAL_TABLET | Freq: Once | ORAL | Status: AC
  Administered 2020-06-21: 8 mg via ORAL
  Filled 2020-06-21: qty 1

## 2020-06-21 MED ORDER — ONDANSETRON 4 MG PO TBDP
ORAL_TABLET | ORAL | 0 refills | Status: DC
Start: 2020-06-21 — End: 2020-09-28

## 2020-06-21 MED ORDER — TAMSULOSIN HCL 0.4 MG PO CAPS: 0.4000 mg | ORAL_CAPSULE | Freq: Every day | ORAL | 0 refills | Status: AC

## 2020-06-21 MED ORDER — OXYCODONE-ACETAMINOPHEN 5-325 MG PO TABS: 2.0000 | ORAL_TABLET | ORAL | 0 refills | Status: DC | PRN

## 2020-06-21 MED ORDER — KETOROLAC TROMETHAMINE 60 MG/2ML IM SOLN
60.0000 mg | Freq: Once | INTRAMUSCULAR | Status: AC
  Administered 2020-06-21: 60 mg via INTRAMUSCULAR
  Filled 2020-06-21: qty 2

## 2020-06-21 MED ORDER — IBUPROFEN 800 MG PO TABS
800.0000 mg | ORAL_TABLET | Freq: Three times a day (TID) | ORAL | 0 refills | Status: DC
Start: 2020-06-21 — End: 2020-07-26

## 2020-06-21 MED ORDER — HYDROMORPHONE HCL 1 MG/ML IJ SOLN
1.0000 mg | Freq: Once | INTRAMUSCULAR | Status: AC
  Administered 2020-06-21: 1 mg via INTRAMUSCULAR
  Filled 2020-06-21: qty 1

## 2020-06-21 NOTE — ED Provider Notes (Signed)
[facility] EMERGENCY DEPARTMENT Provider Note   CSN: [id number] Arrival date & time: 06/21/20  1062     History Chief Complaint  Patient presents with  . Flank Pain    Gregory Clarke is Clarke 25 y.o. male.  History of kidney stones and ulcerative colitis who presents here with pain similar to previous kidney stones.  Patient states that he has had some left-sided CVA pain.  Has been going on for Clarke while.  Some nausea.  Has tried Percocet which did not help   Flank Pain       Past Medical History:  Diagnosis Date  . Anemia   . Asthma   . History of kidney stones   . Kidney stones 03/19/2019  . Ulcerative colitis [facility])     Patient Active Problem List   Diagnosis Date Noted  . Rash and nonspecific skin eruption 01/11/2020  . Ulcerative colitis ([place]) 07/23/2019  . Bronchial asthma 07/23/2019  . Mesenteric lymphadenopathy 03/19/2019  . Iron deficiency anemia due to chronic blood loss 03/17/2019  . IDA (iron deficiency anemia) 03/16/2019  . Bloody diarrhea 03/16/2019  . Foot fracture 09/09/2014    Past Surgical History:  Procedure Laterality Date  . BIOPSY  03/31/2019   Procedure: BIOPSY;  Surgeon: Gregory Clarke;  [facility];  Service: Endoscopy;;  . COLONOSCOPY WITH PROPOFOL N/Clarke 03/31/2019   Procedure: COLONOSCOPY WITH PROPOFOL;  Surgeon: Gregory Clarke;  [facility];  Service: Endoscopy;  Laterality: N/Clarke;  7:30  . TONSILLECTOMY         Family History  Problem Relation Age of Onset  . Healthy Mother   . Healthy Father   . Healthy Sister   . Healthy Brother     Social History   Tobacco Use  . Smoking status: Never Smoker  . Smokeless tobacco: Never Used  Vaping Use  . Vaping Use: Every day  Substance Use Topics  . Alcohol use: No  . Drug use: Yes    Types: Marijuana    Comment: 03/26/19    Home Medications Prior to Admission medications   Medication Sig Start Date End Date Taking? Authorizing Provider   albuterol (VENTOLIN HFA) 108 (90 Base) MCG/ACT inhaler INHALE 2 PUFFS BY MOUTH EVERY 6 HOURS AS NEEDED FOR WHEEZING 03/10/20   Gregory Clarke, Gregory Dawley, Clarke  Cholecalciferol (VITAMIN D) 125 MCG (5000 UT) CAPS Take 5,000 Units/day by mouth daily. 03/31/19   Gregory Clarke  folic acid (FOLVITE) 1 MG tablet Take 1 tablet (1 mg total) by mouth daily. Patient not taking: Reported on 03/21/2020 07/23/19   Gregory Clarke  mesalamine (LIALDA) 1.2 g EC tablet Take 1.2 g by mouth in the morning and at bedtime.    Provider, Historical, Clarke  predniSONE (DELTASONE) 10 MG tablet TAKE 3 TABLETS BY MOUTH ONCE DAILY WITH  BREAKFAST.  DROP  DOSE  BY  5MG  EVERY  WEEK  UNTIL  FINISHED Patient not taking: Reported on 03/21/2020 09/21/19   Gregory Standing, PA-C  promethazine (PHENERGAN) 25 MG tablet Take 1 tablet (25 mg total) by mouth every 6 (six) hours as needed for nausea or vomiting. 08/29/19   Clarke, Gregory A, PA-C    Allergies    Patient has no known allergies.  Review of Systems   Review of Systems  Genitourinary: Positive for flank pain.  All other systems reviewed and are negative.   Physical Exam Updated Vital Signs BP 129/87  Pulse 96   Resp 16   Ht 6' 2"  (1.88 m)   Wt 102.1 kg   SpO2 96%   BMI 28.89 kg/m   Physical Exam Vitals and nursing note reviewed.  Constitutional:      Appearance: He is well-developed.  HENT:     Head: Normocephalic and atraumatic.     Nose: Nose normal. No congestion or rhinorrhea.     Mouth/Throat:     Mouth: Mucous membranes are moist.     Pharynx: Oropharynx is clear.  Eyes:     Pupils: Pupils are equal, round, and reactive to light.  Cardiovascular:     Rate and Rhythm: Normal rate.  Pulmonary:     Effort: Pulmonary effort is normal. No respiratory distress.  Abdominal:     General: Abdomen is flat. There is no distension.  Musculoskeletal:        General: Normal range of motion.     Cervical back: Normal range of motion.  Skin:    General:  Skin is warm and dry.     Coloration: Skin is not jaundiced or pale.  Neurological:     General: No focal deficit present.     Mental Status: He is alert.     ED Results / Procedures / Treatments   Labs (all labs ordered are listed, but only abnormal results are displayed) Labs Reviewed  CBC WITH DIFFERENTIAL/PLATELET  BASIC METABOLIC PANEL  URINALYSIS, ROUTINE W REFLEX MICROSCOPIC    EKG None  Radiology No results found.  Procedures Procedures   Medications Ordered in ED Medications  ketorolac (TORADOL) injection 60 mg (60 mg Intramuscular Given 06/21/20 0545)  HYDROmorphone (DILAUDID) injection 1 mg (1 mg Intramuscular Given 06/21/20 0545)  ondansetron (ZOFRAN-ODT) disintegrating tablet 8 mg (8 mg Oral Given 06/21/20 0545)    ED Course  I have reviewed the triage vital signs and the nursing notes.  Pertinent labs & imaging results that were available during my care of the patient were reviewed by me and considered in my medical decision making (see chart for details).    MDM Rules/Calculators/Clarke&P                         eval for kidney stone  CT with quite distal kidney stone.   Final Clinical Impression(s) / ED Diagnoses Final diagnoses:  Kidney stone    Rx / DC Orders ED Discharge Orders    None       Gregory Clarke, Gregory Cornea, Clarke 06/28/20 2254

## 2020-06-21 NOTE — ED Triage Notes (Signed)
Pt c/o left flank pain that radiates to lower abd and groin since Saturday.

## 2020-07-26 ENCOUNTER — Other Ambulatory Visit: Payer: Self-pay

## 2020-07-26 ENCOUNTER — Ambulatory Visit
Admission: RE | Admit: 2020-07-26 | Discharge: 2020-07-26 | Disposition: A | Discharge status: Home / Self Care | Payer: BC Managed Care – PPO | Source: Ambulatory Visit | Attending: Family Medicine | Admitting: Family Medicine

## 2020-07-26 ENCOUNTER — Ambulatory Visit (INDEPENDENT_AMBULATORY_CARE_PROVIDER_SITE_OTHER): Payer: PRIVATE HEALTH INSURANCE

## 2020-07-26 VITALS — BP 123/88 | HR 90 | Temp 98.2°F | Ht 74.0 in | Wt 225.0 lb

## 2020-07-26 DIAGNOSIS — M25512 Pain in left shoulder: Secondary | ICD-10-CM

## 2020-07-26 MED ORDER — CYCLOBENZAPRINE HCL 10 MG PO TABS: 10.0000 mg | ORAL_TABLET | Freq: Two times a day (BID) | ORAL | 0 refills | Status: DC | PRN

## 2020-07-26 MED ORDER — TRAMADOL HCL 50 MG PO TABS: 50.0000 mg | ORAL_TABLET | Freq: Four times a day (QID) | ORAL | 0 refills | Status: DC | PRN

## 2020-07-26 MED ORDER — IBUPROFEN 800 MG PO TABS: 800.0000 mg | ORAL_TABLET | Freq: Three times a day (TID) | ORAL | 0 refills | Status: DC | PRN

## 2020-07-26 NOTE — ED Provider Notes (Signed)
[facility]    CSN: [id number] Arrival date & time: 07/26/20  1640      History   Chief Complaint Chief Complaint  Patient presents with  . Shoulder Pain    Pt states that he has some left shoulder pain. Pt states that he popped shoulder back in place but never had it looked at a few years ago. Pt states he has had constant pain for 2 weeks .    HPI Gregory Clarke is a 25 y.o. male.   Reports left shoulder pain for the last week. Reports that it has popped in and out of place a few years ago. Has taken OTC motrin and tylenol with no relief. Has used ice as well. Reports pain is worse with movement, limited ROM. Denies constant pain like this in the past. Denies popping or cracking in the joint, numbness, tingling, radiating pain, other symptoms.   ROS per HPI  The history is provided by the patient.  Shoulder Pain   Past Medical History:  Diagnosis Date  . Anemia   . Asthma   . History of kidney stones   . Kidney stones 03/19/2019  . Ulcerative colitis [facility])     Patient Active Problem List   Diagnosis Date Noted  . Rash and nonspecific skin eruption 01/11/2020  . Ulcerative colitis ([place]) 07/23/2019  . Bronchial asthma 07/23/2019  . Mesenteric lymphadenopathy 03/19/2019  . Iron deficiency anemia due to chronic blood loss 03/17/2019  . IDA (iron deficiency anemia) 03/16/2019  . Bloody diarrhea 03/16/2019  . Foot fracture 09/09/2014    Past Surgical History:  Procedure Laterality Date  . BIOPSY  03/31/2019   Procedure: BIOPSY;  Surgeon: Rogene Houston, MD;  [facility];  Service: Endoscopy;;  . COLONOSCOPY WITH PROPOFOL N/A 03/31/2019   Procedure: COLONOSCOPY WITH PROPOFOL;  Surgeon: Rogene Houston, MD;  [facility];  Service: Endoscopy;  Laterality: N/A;  7:30  . TONSILLECTOMY         Home Medications    Prior to Admission medications   Medication Sig Start Date End Date Taking? Authorizing Provider  albuterol  (VENTOLIN HFA) 108 (90 Base) MCG/ACT inhaler INHALE 2 PUFFS BY MOUTH EVERY 6 HOURS AS NEEDED FOR WHEEZING 03/10/20  Yes Rehman, Mechele Dawley, MD  Cholecalciferol (VITAMIN D) 125 MCG (5000 UT) CAPS Take 5,000 Units/day by mouth daily. 03/31/19  Yes Rehman, Mechele Dawley, MD  cyclobenzaprine (FLEXERIL) 10 MG tablet Take 1 tablet (10 mg total) by mouth 2 (two) times daily as needed for muscle spasms. 07/26/20  Yes Faustino Congress, NP  folic acid (FOLVITE) 1 MG tablet Take 1 tablet (1 mg total) by mouth daily. 07/23/19  Yes Rehman, Mechele Dawley, MD  ibuprofen (ADVIL) 800 MG tablet Take 1 tablet (800 mg total) by mouth every 8 (eight) hours as needed for moderate pain. 07/26/20  Yes Faustino Congress, NP  traMADol (ULTRAM) 50 MG tablet Take 1 tablet (50 mg total) by mouth every 6 (six) hours as needed. 07/26/20  Yes Faustino Congress, NP  mesalamine (LIALDA) 1.2 g EC tablet Take 1.2 g by mouth in the morning and at bedtime.    [provider]  ondansetron (ZOFRAN ODT) 4 MG disintegrating tablet 92m ODT q4 hours prn nausea/vomit 06/21/20   Mesner, JCorene Cornea MD  predniSONE (DELTASONE) 10 MG tablet TAKE 3 TABLETS BY MOUTH ONCE DAILY WITH  BREAKFAST.  DROP  DOSE  BY  5MG  EVERY  WEEK  UNTIL  FINISHED Patient  not taking: No sig reported 09/21/19   Ezzard Standing, PA-C  promethazine (PHENERGAN) 25 MG tablet Take 1 tablet (25 mg total) by mouth every 6 (six) hours as needed for nausea or vomiting. 08/29/19   Henderly, Britni A, PA-C    Family History Family History  Problem Relation Age of Onset  . Healthy Mother   . Healthy Father   . Healthy Sister   . Healthy Brother     Social History Social History   Tobacco Use  . Smoking status: Never Smoker  . Smokeless tobacco: Never Used  Vaping Use  . Vaping Use: Every day  Substance Use Topics  . Alcohol use: No  . Drug use: Yes    Types: Marijuana    Comment: 03/26/19     Allergies   Patient has no known allergies.   Review of Systems Review of  Systems   Physical Exam Triage Vital Signs ED Triage Vitals  Enc Vitals Group     BP 07/26/20 1713 123/88     Pulse Rate 07/26/20 1713 90     Resp --      Temp 07/26/20 1713 98.2 F (36.8 C)     Temp Source 07/26/20 1713 Oral     SpO2 07/26/20 1713 96 %     Weight 07/26/20 1711 225 lb (102.1 kg)     Height 07/26/20 1711 6' 2"  (1.88 m)     Head Circumference --      Peak Flow --      Pain Score 07/26/20 1711 6     Pain Loc --      Pain Edu? --      Excl. in [place]? --    No data found.  Updated Vital Signs BP 123/88 (BP Location: Right Arm)   Pulse 90   Temp 98.2 F (36.8 C) (Oral)   Ht 6' 2"  (1.88 m)   Wt 225 lb (102.1 kg)   SpO2 96%   BMI 28.89 kg/m          Physical Exam Vitals and nursing note reviewed.  Constitutional:      General: He is not in acute distress.    Appearance: Normal appearance. He is well-developed and normal weight. He is not ill-appearing.  HENT:     Head: Normocephalic and atraumatic.     Nose: Nose normal.     Mouth/Throat:     Mouth: Mucous membranes are moist.     Pharynx: Oropharynx is clear.  Eyes:     Extraocular Movements: Extraocular movements intact.     Conjunctiva/sclera: Conjunctivae normal.     Pupils: Pupils are equal, round, and reactive to light.  Cardiovascular:     Rate and Rhythm: Normal rate and regular rhythm.  Pulmonary:     Effort: Pulmonary effort is normal. No respiratory distress.  Abdominal:     Palpations: Abdomen is soft.     Tenderness: There is no abdominal tenderness.  Musculoskeletal:        General: Tenderness (anterior L shoulder) present.     Cervical back: Normal range of motion and neck supple.  Skin:    General: Skin is warm and dry.     Capillary Refill: Capillary refill takes less than 2 seconds.  Neurological:     General: No focal deficit present.     Mental Status: He is alert.  Psychiatric:        Mood and Affect: Mood normal.        Behavior:  Behavior normal.        Thought  Content: Thought content normal.      UC Treatments / Results  Labs (all labs ordered are listed, but only abnormal results are displayed) Labs Reviewed - No data to display  EKG   Radiology No results found.  Procedures Procedures (including critical care time)  Medications Ordered in UC Medications - No data to display  Initial Impression / Assessment and Plan / UC Course  I have reviewed the triage vital signs and the nursing notes.  Pertinent labs & imaging results that were available during my care of the patient were reviewed by me and considered in my medical decision making (see chart for details).    Left Shoulder Pain  Xray today suggest mild shoulder separation Prescribed ibuprofen 883m one tablet every 8 hours as needed for pain Prescribed cyclobenzaprine BID prn muscle spasms Sedation precautions given Prescribed tramadol for breakthrough pain PDMP reviewed Follow up with orthopedics for further imaging May need surgical repair Follow up with this office or with primary care if symptoms are persisting.  Follow up in the ER for high fever, trouble swallowing, trouble breathing, other concerning symptoms.   Final Clinical Impressions(s) / UC Diagnoses   Final diagnoses:  Acute pain of left shoulder     Discharge Instructions     You may have a joint separation  I have sent in ibuprofen for you to take one tablet every 8 hours as needed for pain and inflammation.  I have sent in flexeril for you to take twice a day as needed for muscle spasms. This medication can make you sleepy. Do not drive or operate heavy machinery with this medication.  I have sent in tramadol for you to take for breakthrough pain, 1 tablet every 6 hours as needed  Follow up with orthopedics for further management    ED Prescriptions    Medication Sig Dispense Auth. Provider   ibuprofen (ADVIL) 800 MG tablet Take 1 tablet (800 mg total) by mouth every 8 (eight) hours  as needed for moderate pain. 21 tablet MFaustino Congress NP   cyclobenzaprine (FLEXERIL) 10 MG tablet Take 1 tablet (10 mg total) by mouth 2 (two) times daily as needed for muscle spasms. 20 tablet MFaustino Congress NP   traMADol (ULTRAM) 50 MG tablet Take 1 tablet (50 mg total) by mouth every 6 (six) hours as needed. 15 tablet MFaustino Congress NP     I have reviewed the PDMP during this encounter.   MFaustino Congress NP 07/30/20 1100

## 2020-07-26 NOTE — Discharge Instructions (Signed)
You may have a joint separation  I have sent in ibuprofen for you to take one tablet every 8 hours as needed for pain and inflammation.  I have sent in flexeril for you to take twice a day as needed for muscle spasms. This medication can make you sleepy. Do not drive or operate heavy machinery with this medication.  I have sent in tramadol for you to take for breakthrough pain, 1 tablet every 6 hours as needed  Follow up with orthopedics for further management

## 2020-07-26 NOTE — ED Triage Notes (Signed)
Pt states that he has some left shoulder pain.

## 2020-09-16 ENCOUNTER — Encounter (INDEPENDENT_AMBULATORY_CARE_PROVIDER_SITE_OTHER): Payer: Self-pay

## 2020-09-19 ENCOUNTER — Ambulatory Visit (INDEPENDENT_AMBULATORY_CARE_PROVIDER_SITE_OTHER): Payer: PRIVATE HEALTH INSURANCE | Admitting: Gastroenterology

## 2020-09-19 ENCOUNTER — Encounter (INDEPENDENT_AMBULATORY_CARE_PROVIDER_SITE_OTHER): Payer: Self-pay | Admitting: Gastroenterology

## 2020-09-28 ENCOUNTER — Other Ambulatory Visit: Payer: Self-pay

## 2020-09-28 ENCOUNTER — Ambulatory Visit
Admission: RE | Admit: 2020-09-28 | Discharge: 2020-09-28 | Disposition: A | Discharge status: Home / Self Care | Payer: 59 | Source: Ambulatory Visit | Attending: Family Medicine | Admitting: Family Medicine

## 2020-09-28 ENCOUNTER — Encounter (HOSPITAL_COMMUNITY): Payer: Self-pay | Admitting: Hematology

## 2020-09-28 VITALS — BP 119/73 | HR 101 | Temp 97.8°F | Resp 16

## 2020-09-28 DIAGNOSIS — L2082 Flexural eczema: Secondary | ICD-10-CM | POA: Diagnosis not present

## 2020-09-28 MED ORDER — TRIAMCINOLONE ACETONIDE 0.5 % EX CREA: TOPICAL_CREAM | Freq: Two times a day (BID) | CUTANEOUS | 2 refills | Status: DC

## 2020-09-28 NOTE — ED Triage Notes (Signed)
Itchy rash x 1 year on and off.

## 2020-09-28 NOTE — ED Provider Notes (Signed)
  [place]   [id number] 09/28/20 Arrival Time: 4332  ASSESSMENT & PLAN:  1. Flexural eczema    No signs of skin infection. Begin: Meds ordered this encounter  Medications   triamcinolone 0.5%-Eucerin equivalent 1:1 cream mixture    Sig: Apply topically 2 (two) times daily.    Dispense:  80 g    Refill:  2   Will follow up with PCP or here if worsening or failing to improve as anticipated. Reviewed expectations re: course of current medical issues. Questions answered. Outlined signs and symptoms indicating need for more acute intervention. Patient verbalized understanding. After Visit Summary given.   SUBJECTIVE:  Gregory Clarke is a 25 y.o. male who presents with a skin complaint. Itchy rash on flexural surfaces of arms and legs. On/off x 1 year. No specific aggravating or alleviating factors reported. Distant h/o steroid cream use that helped. Otherwise well.   OBJECTIVE: Vitals:   09/28/20 1817  BP: 119/73  Pulse: (!) 101  Resp: 16  Temp: 97.8 F (36.6 C)  TempSrc: Tympanic  SpO2: 95%    General appearance: alert; no distress Extremities: no edema; moves all extremities normally Skin: warm and dry; eczema of flexural surfaces of arms and legs; no signs of infection Psychological: alert and cooperative; normal mood and affect  No Known Allergies  Past Medical History:  Diagnosis Date   Anemia    Asthma    History of kidney stones    Kidney stones 03/19/2019   Ulcerative colitis ([place])    Social History   Socioeconomic History   Marital status: Single    Spouse name: Not on file   Number of children: 0   Years of education: Not on file   Highest education level: Not on file  Occupational History   Occupation: umemployed  Tobacco Use   Smoking status: Never   Smokeless tobacco: Never  Vaping Use   Vaping Use: Every day  Substance and Sexual Activity   Alcohol use: No   Drug use: Yes    Types: Marijuana    Comment: 03/26/19    Sexual activity: Yes  Other Topics Concern   Not on file  Social History Narrative   Not on file   Social Determinants of Health   Financial Resource Strain: Not on file  Food Insecurity: Not on file  Transportation Needs: Not on file  Physical Activity: Not on file  Stress: Not on file  Social Connections: Not on file  Intimate Partner Violence: Not on file   Family History  Problem Relation Age of Onset   Healthy Mother    Healthy Father    Healthy Sister    Healthy Brother    Past Surgical History:  Procedure Laterality Date   BIOPSY  03/31/2019   Procedure: BIOPSY;  Surgeon: Rogene Houston, MD;  [facility];  Service: Endoscopy;;   COLONOSCOPY WITH PROPOFOL N/A 03/31/2019   Procedure: COLONOSCOPY WITH PROPOFOL;  Surgeon: Rogene Houston, MD;  [facility];  Service: Endoscopy;  Laterality: N/A;  7:30   TONSILLECTOMY        Vanessa Kick, MD 09/28/20 [phone]

## 2020-09-30 ENCOUNTER — Telehealth: Payer: Self-pay | Admitting: Emergency Medicine

## 2020-09-30 MED ORDER — TRIAMCINOLONE ACETONIDE 0.1 % EX CREA
1.0000 | TOPICAL_CREAM | Freq: Two times a day (BID) | CUTANEOUS | 0 refills | Status: DC
Start: 2020-09-30 — End: 2021-04-07

## 2020-09-30 NOTE — Telephone Encounter (Signed)
[facility] request resend in triamcinolone

## 2020-10-22 ENCOUNTER — Encounter (HOSPITAL_COMMUNITY): Payer: Self-pay | Admitting: Hematology

## 2020-11-02 ENCOUNTER — Other Ambulatory Visit (INDEPENDENT_AMBULATORY_CARE_PROVIDER_SITE_OTHER): Payer: Self-pay | Admitting: Internal Medicine

## 2020-11-03 NOTE — Telephone Encounter (Signed)
This is a medication prescribed by PCP, we do not refill this

## 2020-11-03 NOTE — Telephone Encounter (Signed)
Will address with Dr. Laural Golden.

## 2021-02-07 ENCOUNTER — Ambulatory Visit
Admission: RE | Admit: 2021-02-07 | Discharge: 2021-02-07 | Disposition: A | Discharge status: Home / Self Care | Payer: 59 | Source: Ambulatory Visit | Attending: Student | Admitting: Student

## 2021-02-07 ENCOUNTER — Other Ambulatory Visit: Payer: Self-pay

## 2021-02-07 VITALS — BP 105/78 | HR 124 | Temp 98.8°F | Resp 18

## 2021-02-07 DIAGNOSIS — J069 Acute upper respiratory infection, unspecified: Secondary | ICD-10-CM

## 2021-02-07 DIAGNOSIS — Z20828 Contact with and (suspected) exposure to other viral communicable diseases: Secondary | ICD-10-CM

## 2021-02-07 MED ORDER — AMOXICILLIN-POT CLAVULANATE 875-125 MG PO TABS: 1.0000 | ORAL_TABLET | Freq: Two times a day (BID) | ORAL | 0 refills | Status: DC

## 2021-02-07 MED ORDER — PREDNISONE 20 MG PO TABS: 40.0000 mg | ORAL_TABLET | Freq: Every day | ORAL | 0 refills | Status: AC

## 2021-02-07 NOTE — ED Provider Notes (Signed)
[facility]    CSN: [id number] Arrival date & time: 02/07/21  1026      History   Chief Complaint No chief complaint on file.   HPI Gregory Clarke is a 25 y.o. male presenting with viral syndrome for about 4 days.  He did have COVID-19 about 1 month ago, this resolved until the present illness.  Medical history asthma, this is well controlled on albuterol inhaler, and he has been off of this.  Describes fevers as high as 102 this morning, generalized body aches, left ear pain with muffled hearing and dizziness, nausea without vomiting.  Tolerating fluids and food.  Has some promethazine at home, this has been providing relief for the nausea.  Nasal congestion is thick and yellow, there was a tiny amount of blood in it.  Over-the-counter medications are not providing relief.  Denies shortness of breath, chest pain.  HPI  Past Medical History:  Diagnosis Date   Anemia    Asthma    History of kidney stones    Kidney stones 03/19/2019   Ulcerative colitis [facility])     Patient Active Problem List   Diagnosis Date Noted   Rash and nonspecific skin eruption 01/11/2020   Ulcerative colitis ([place]) 07/23/2019   Bronchial asthma 07/23/2019   Mesenteric lymphadenopathy 03/19/2019   Iron deficiency anemia due to chronic blood loss 03/17/2019   IDA (iron deficiency anemia) 03/16/2019   Bloody diarrhea 03/16/2019   Foot fracture 09/09/2014    Past Surgical History:  Procedure Laterality Date   BIOPSY  03/31/2019   Procedure: BIOPSY;  Surgeon: Rogene Houston, MD;  [facility];  Service: Endoscopy;;   COLONOSCOPY WITH PROPOFOL N/A 03/31/2019   Procedure: COLONOSCOPY WITH PROPOFOL;  Surgeon: Rogene Houston, MD;  [facility];  Service: Endoscopy;  Laterality: N/A;  7:30   TONSILLECTOMY         Home Medications    Prior to Admission medications   Medication Sig Start Date End Date Taking? Authorizing Provider  amoxicillin-clavulanate  (AUGMENTIN) 875-125 MG tablet Take 1 tablet by mouth every 12 (twelve) hours. 02/07/21  Yes Hazel Sams, PA-C  predniSONE (DELTASONE) 20 MG tablet Take 2 tablets (40 mg total) by mouth daily for 5 days. Take with breakfast or lunch. Avoid NSAIDs (ibuprofen, etc) while taking this medication. 02/07/21 02/12/21 Yes Hazel Sams, PA-C  albuterol (VENTOLIN HFA) 108 (90 Base) MCG/ACT inhaler SMARTSIG:1 Puff(s) By Mouth Every 4 Hours PRN 01/09/21   [provider]  triamcinolone cream (KENALOG) 0.1 % Apply 1 application topically 2 (two) times daily. 09/30/20   Lestine Box, PA-C    Family History Family History  Problem Relation Age of Onset   Healthy Mother    Healthy Father    Healthy Sister    Healthy Brother     Social History Social History   Tobacco Use   Smoking status: Never   Smokeless tobacco: Never  Vaping Use   Vaping Use: Every day  Substance Use Topics   Alcohol use: No   Drug use: Yes    Types: Marijuana    Comment: 03/26/19     Allergies   Patient has no known allergies.   Review of Systems Review of Systems  Constitutional:  Positive for chills and fever. Negative for appetite change.  HENT:  Positive for congestion and ear pain. Negative for rhinorrhea, sinus pressure, sinus pain and sore throat.   Eyes:  Negative for redness and visual disturbance.  Respiratory:  Positive for cough. Negative for chest tightness, shortness of breath and wheezing.   Cardiovascular:  Negative for chest pain and palpitations.  Gastrointestinal:  Positive for nausea. Negative for abdominal pain, constipation, diarrhea and vomiting.  Genitourinary:  Negative for dysuria, frequency and urgency.  Musculoskeletal:  Negative for myalgias.  Neurological:  Negative for dizziness, weakness and headaches.  Psychiatric/Behavioral:  Negative for confusion.   All other systems reviewed and are negative.   Physical Exam Triage Vital Signs ED Triage Vitals  Enc Vitals  Group     BP 02/07/21 1123 105/78     Pulse Rate 02/07/21 1123 (!) 124     Resp 02/07/21 1123 18     Temp 02/07/21 1123 98.8 F (37.1 C)     Temp Source 02/07/21 1123 Tympanic     SpO2 02/07/21 1123 96 %     Weight --      Height --      Head Circumference --      Peak Flow --      Pain Score 02/07/21 1122 5     Pain Loc --      Pain Edu? --      Excl. in [place]? --    No data found.  Updated Vital Signs BP 105/78 (BP Location: Right Arm)   Pulse (!) 124   Temp 98.8 F (37.1 C) (Tympanic)   Resp 18   SpO2 96%   Visual Acuity Right Eye Distance:   Left Eye Distance:   Bilateral Distance:    Right Eye Near:   Left Eye Near:    Bilateral Near:     Physical Exam Vitals reviewed.  Constitutional:      General: He is not in acute distress.    Appearance: Normal appearance. He is not ill-appearing.  HENT:     Head: Normocephalic and atraumatic.     Right Ear: Tympanic membrane, ear canal and external ear normal. No tenderness. No middle ear effusion. There is no impacted cerumen. Tympanic membrane is not perforated, erythematous, retracted or bulging.     Left Ear: Ear canal and external ear normal. Swelling and tenderness present.  No middle ear effusion. There is no impacted cerumen. Tympanic membrane is erythematous. Tympanic membrane is not perforated, retracted or bulging.     Nose: Nose normal. No congestion.     Mouth/Throat:     Mouth: Mucous membranes are moist.     Pharynx: Uvula midline. No oropharyngeal exudate or posterior oropharyngeal erythema.  Eyes:     Extraocular Movements: Extraocular movements intact.     Pupils: Pupils are equal, round, and reactive to light.  Cardiovascular:     Rate and Rhythm: Regular rhythm. Tachycardia present.     Heart sounds: Normal heart sounds.  Pulmonary:     Effort: Pulmonary effort is normal.     Breath sounds: Normal breath sounds. No decreased breath sounds, wheezing, rhonchi or rales.  Abdominal:     Palpations:  Abdomen is soft.     Tenderness: There is no abdominal tenderness. There is no guarding or rebound.  Lymphadenopathy:     Cervical: No cervical adenopathy.     Right cervical: No superficial cervical adenopathy.    Left cervical: No superficial cervical adenopathy.  Neurological:     General: No focal deficit present.     Mental Status: He is alert and oriented to person, place, and time.  Psychiatric:        Mood and Affect: Mood  normal.        Behavior: Behavior normal.        Thought Content: Thought content normal.        Judgment: Judgment normal.     UC Treatments / Results  Labs (all labs ordered are listed, but only abnormal results are displayed) Labs Reviewed  COVID-19, FLU A+B NAA    EKG   Radiology No results found.  Procedures Procedures (including critical care time)  Medications Ordered in UC Medications - No data to display  Initial Impression / Assessment and Plan / UC Course  I have reviewed the triage vital signs and the nursing notes.  Pertinent labs & imaging results that were available during my care of the patient were reviewed by me and considered in my medical decision making (see chart for details).     This patient is a very pleasant 25 y.o. year old male presenting with L AOM related to influenza. Today this pt is tachy but afebrile nontachypneic, oxygenating well on room air, no wheezes rhonchi or rales. History asthma, currently well controlled on albuterol inhaler as needed, continue this at home.  Covid and influenza PCR sent. He is out of the Tamiflu window.  Low-dose prednisone sent, Augmentin sent for AOM.  Continue albuterol, promethazine, Tessalon, ibuprofen that he has at home already.  ED return precautions discussed. Patient verbalizes understanding and agreement.   Coding Level 4 for acute illness with systemic symptoms, and prescription drug management   Final Clinical Impressions(s) / UC Diagnoses   Final diagnoses:   Viral URI with cough  Exposure to influenza     Discharge Instructions      -Prednisone, 2 pills taken at the same time for 5 days in a row.  Try taking this earlier in the day as it can give you energy. Avoid NSAIDs like ibuprofen and alleve while taking this medication as they can increase your risk of stomach upset and even GI bleeding when in combination with a steroid. You can continue tylenol (acetaminophen) up to 1028m 3x daily. -Start the antibiotic-Augmentin (amoxicillin-clavulanate), 1 pill every 12 hours for 7 days.  You can take this with food like with breakfast and dinner. -Continue these medicines that you have at home: albuterol, promethazine, tessalon, ibuprofen -Continue drinking plenty of water  -With a virus, you're typically contagious for 5-7 days, or as long as you're having fevers.       ED Prescriptions     Medication Sig Dispense Auth. Provider   amoxicillin-clavulanate (AUGMENTIN) 875-125 MG tablet Take 1 tablet by mouth every 12 (twelve) hours. 14 tablet GHazel Sams PA-C   predniSONE (DELTASONE) 20 MG tablet Take 2 tablets (40 mg total) by mouth daily for 5 days. Take with breakfast or lunch. Avoid NSAIDs (ibuprofen, etc) while taking this medication. 10 tablet GHazel Sams PA-C      PDMP not reviewed this encounter.   GHazel Sams PA-C 02/07/21 1233

## 2021-02-07 NOTE — ED Triage Notes (Signed)
Patient states he is experiencing a lot of Flu like symptoms. He states he tested positive for Covid about a month ago.    A lot of congestion, both ears are hurting, with dizziness and nausea. He ran a fever this morning at 102.0. He took tylenol and ibuprofin alternating.   When blowing nose thick bright yellow green with a little blood.  Dayquil and Nyquil are not helping.

## 2021-02-07 NOTE — Discharge Instructions (Signed)
-  Prednisone, 2 pills taken at the same time for 5 days in a row.  Try taking this earlier in the day as it can give you energy. Avoid NSAIDs like ibuprofen and alleve while taking this medication as they can increase your risk of stomach upset and even GI bleeding when in combination with a steroid. You can continue tylenol (acetaminophen) up to 1024m 3x daily. -Start the antibiotic-Augmentin (amoxicillin-clavulanate), 1 pill every 12 hours for 7 days.  You can take this with food like with breakfast and dinner. -Continue these medicines that you have at home: albuterol, promethazine, tessalon, ibuprofen -Continue drinking plenty of water  -With a virus, you're typically contagious for 5-7 days, or as long as you're having fevers.

## 2021-02-08 LAB — COVID-19, FLU A+B NAA
Influenza A, NAA: DETECTED — AB
Influenza B, NAA: NOT DETECTED
SARS-CoV-2, NAA: NOT DETECTED

## 2021-04-07 ENCOUNTER — Other Ambulatory Visit: Payer: Self-pay

## 2021-04-07 ENCOUNTER — Ambulatory Visit
Admission: RE | Admit: 2021-04-07 | Discharge: 2021-04-07 | Disposition: A | Discharge status: Home / Self Care | Payer: 59 | Source: Ambulatory Visit | Attending: Emergency Medicine | Admitting: Emergency Medicine

## 2021-04-07 VITALS — BP 117/81 | HR 87 | Temp 97.6°F | Resp 18

## 2021-04-07 DIAGNOSIS — Z9189 Other specified personal risk factors, not elsewhere classified: Secondary | ICD-10-CM

## 2021-04-07 DIAGNOSIS — J069 Acute upper respiratory infection, unspecified: Secondary | ICD-10-CM | POA: Diagnosis not present

## 2021-04-07 DIAGNOSIS — N200 Calculus of kidney: Secondary | ICD-10-CM

## 2021-04-07 DIAGNOSIS — J4541 Moderate persistent asthma with (acute) exacerbation: Secondary | ICD-10-CM

## 2021-04-07 LAB — POCT URINALYSIS DIP (MANUAL ENTRY)
Bilirubin, UA: NEGATIVE
Blood, UA: NEGATIVE
Glucose, UA: NEGATIVE mg/dL
Ketones, POC UA: NEGATIVE mg/dL
Leukocytes, UA: NEGATIVE
Nitrite, UA: NEGATIVE
Protein Ur, POC: NEGATIVE mg/dL
Spec Grav, UA: >=1.03 — AB (ref 1.010–1.025)
Urobilinogen, UA: 0.2 E.U./dL
pH, UA: 5.5 (ref 5.0–8.0)

## 2021-04-07 MED ORDER — AEROCHAMBER PLUS MISC: 2 refills | Status: AC

## 2021-04-07 MED ORDER — FLUTICASONE PROPIONATE 50 MCG/ACT NA SUSP: 2.0000 | Freq: Every day | NASAL | 0 refills | Status: DC

## 2021-04-07 MED ORDER — TAMSULOSIN HCL 0.4 MG PO CAPS: 0.4000 mg | ORAL_CAPSULE | Freq: Every day | ORAL | 0 refills | Status: AC

## 2021-04-07 MED ORDER — HYDROCODONE-ACETAMINOPHEN 5-325 MG PO TABS: 1.0000 | ORAL_TABLET | Freq: Four times a day (QID) | ORAL | 0 refills | Status: DC | PRN

## 2021-04-07 MED ORDER — PREDNISONE 50 MG PO TABS: 50.0000 mg | ORAL_TABLET | Freq: Every day | ORAL | 0 refills | Status: DC

## 2021-04-07 NOTE — Discharge Instructions (Addendum)
2 puffs from your albuterol inhaler using your spacer every 4 hours for 2 days, then every 6 hours for 2 days, then as needed.  May back off on the albuterol if you start to feel better sooner.  Saline nasal irrigation with a Milta Deiters Med rinse and distilled water as often as you want, Mucinex D, Flonase.  Finish the prednisone, even if you feel better.  Push extra fluids.  Your urine was very concentrated today.  I have refilled your Flomax and Norco.  Please follow-up with urology ASAP.

## 2021-04-07 NOTE — ED Provider Notes (Signed)
HPI  SUBJECTIVE:  Gregory Clarke is a 26 y.o. male who presents with 2 issues: First, he reports nasal congestion, cough, chest congestion, body aches, headaches, left ear pain, postnasal drip, wheezing, shortness of breath, dyspnea on exertion and fevers T-max 100.  He had clear rhinorrhea, but this has since resolved.  He had negative home COVID test.  No known COVID or flu exposure.  He did not get the COVID or flu vaccines.  No antipyretic in the past 6 hours.  He has been using his albuterol inhaler more than usual and pushing fluids.  The albuterol helps.  No aggravating factors.  Second, he reports possible right-sided nephrolithiasis starting 2 days ago.  He reports waxing and waning sharp, stabbing alternating with dull right-sided back pain that is moving anteriorly.  He reports accompanying nausea when the pain is severe.  No vomiting, other abdominal pain, dysuria, urinary urgency, frequency, cloudy or odorous urine, hematuria.  He states that he is able to completely empty his bladder.  He states his symptoms are identical to previous nephrolithiasis.  He has tried Flomax and some leftover OxyContin with improvement in his symptoms.  Symptoms are worse with certain positions.  He has a past medical history of nonobstructing nephrolithiasis, ulcerative colitis, asthma and COVID in October 22.  PMD: None.  Urology: None.   Past Medical History:  Diagnosis Date   Anemia    Asthma    History of kidney stones    Kidney stones 03/19/2019   Ulcerative colitis [facility])     Past Surgical History:  Procedure Laterality Date   BIOPSY  03/31/2019   Procedure: BIOPSY;  Surgeon: Rogene Houston, MD;  Location: [facility];  Service: Endoscopy;;   COLONOSCOPY WITH PROPOFOL N/A 03/31/2019   Procedure: COLONOSCOPY WITH PROPOFOL;  Surgeon: Rogene Houston, MD;  Location: [facility];  Service: Endoscopy;  Laterality: N/A;  7:30   TONSILLECTOMY      Family History  Problem Relation Age of  Onset   Healthy Mother    Healthy Father    Healthy Sister    Healthy Brother     Social History   Tobacco Use   Smoking status: Never   Smokeless tobacco: Never  Vaping Use   Vaping Use: Every day  Substance Use Topics   Alcohol use: No   Drug use: Yes    Types: Marijuana    Comment: 03/26/19    No current facility-administered medications for this encounter.  Current Outpatient Medications:    fluticasone (FLONASE) 50 MCG/ACT nasal spray, Place 2 sprays into both nostrils daily., Disp: 16 g, Rfl: 0   HYDROcodone-acetaminophen (NORCO/VICODIN) 5-325 MG tablet, Take 1-2 tablets by mouth every 6 (six) hours as needed for moderate pain., Disp: 12 tablet, Rfl: 0   predniSONE (DELTASONE) 50 MG tablet, Take 1 tablet (50 mg total) by mouth daily with breakfast., Disp: 5 tablet, Rfl: 0   Spacer/Aero-Holding Chambers (AEROCHAMBER PLUS) inhaler, Use as instructed, Disp: 1 each, Rfl: 2   tamsulosin (FLOMAX) 0.4 MG CAPS capsule, Take 1 capsule (0.4 mg total) by mouth at bedtime for 7 days., Disp: 7 capsule, Rfl: 0   albuterol (VENTOLIN HFA) 108 (90 Base) MCG/ACT inhaler, SMARTSIG:1 Puff(s) By Mouth Every 4 Hours PRN, Disp: , Rfl:   No Known Allergies   ROS  As noted in HPI.   Physical Exam  BP 117/81 (BP Location: Right Arm)    Pulse 87    Temp 97.6 F (36.4 C) (Oral)  Resp 18    SpO2 97%   Constitutional: Well developed, well nourished, no acute distress Eyes:  EOMI, conjunctiva normal bilaterally HENT: Normocephalic, atraumatic,mucus membranes moist.  Positive mucoid nasal congestion.  Normal turbinates.  No maxillary, frontal sinus tenderness.  No obvious postnasal drip. Neck: No cervical lymphadenopathy Respiratory: Normal inspiratory effort. diffuse wheezing and rhonchi throughout all lung fields.  Prolonged expiratory phase.  No anterior, lateral chest wall tenderness Cardiovascular: Normal rate, regular rhythm, no murmurs, rubs, gallop GI: nondistended.  Positive mild  suprapubic, flank tenderness bilaterally.  Negative Murphy, negative McBurney. Back: Mild right CVAT, patient describes this as "sore" skin: No rash, skin intact Musculoskeletal: no deformities Neurologic: Alert & oriented x 3, no focal neuro deficits Psychiatric: Speech and behavior appropriate   ED Course   Medications - No data to display  Orders Placed This Encounter  Procedures   Covid-19, Flu A+B ([facility])    Standing Status:   Standing    Number of Occurrences:   1   POCT urinalysis dipstick    Standing Status:   Standing    Number of Occurrences:   1    Results for orders placed or performed during the hospital encounter of 04/07/21 (from the past 24 hour(s))  POCT urinalysis dipstick     Status: Abnormal   Collection Time: 04/07/21  9:17 AM  Result Value Ref Range   Color, UA yellow yellow   Clarity, UA clear clear   Glucose, UA negative negative mg/dL   Bilirubin, UA negative negative   Ketones, POC UA negative negative mg/dL   Spec Grav, UA >=1.030 (A) 1.010 - 1.025   Blood, UA negative negative   pH, UA 5.5 5.0 - 8.0   Protein Ur, POC negative negative mg/dL   Urobilinogen, UA 0.2 0.2 or 1.0 E.U./dL   Nitrite, UA Negative Negative   Leukocytes, UA Negative Negative   No results found.  ED Clinical Impression  1. Viral URI with cough   2. At increased risk of exposure to COVID-19 virus   3. Moderate persistent asthma with acute exacerbation   4. Nephrolithiasis      ED Assessment/Plan  1.  URI.  COVID, flu sent.  Unfortunately, he is out of the treatment window for both.  However, he wishes to proceed with testing.  Suspect asthma exacerbation from recent URI.  Home with Flonase, 50 mg of prednisone for 5 days, regularly scheduled albuterol with a spacer.  He does not need a refill on his albuterol inhaler.  Advised saline nasal irrigation, Mucinex D, Flonase.  He has no sinus tenderness concerning for sinusitis.  He is afebrile, has no focal lung  findings, and is satting well on room air, deferring chest x-ray today.  Patient to follow-up with [facility] primary care, will also order assistance in finding a PMD.  2.  Lower back pain.  Urine concentrated, negative for hematuria or infection however, patient states he gets frequent kidney stones and this feels identical to that.  Deferring imaging as he does not have a history of obstructing nephrolithiasis.  will refill Flomax, and sent home with Norco.  He is to follow-up with [facility] ASAP.  [facility] narcotic database reviewed..  Last opiate prescription was on 07/26/2020 for tramadol.  He has had 2 narcotic prescriptions in the past 2 years.  Discussed labs, MDM, treatment plan, and plan for follow-up with patient. Discussed sn/sx that should prompt return to the ED. patient agrees with plan.   Meds ordered  this encounter  Medications   fluticasone (FLONASE) 50 MCG/ACT nasal spray    Sig: Place 2 sprays into both nostrils daily.    Dispense:  16 g    Refill:  0   predniSONE (DELTASONE) 50 MG tablet    Sig: Take 1 tablet (50 mg total) by mouth daily with breakfast.    Dispense:  5 tablet    Refill:  0   Spacer/Aero-Holding Chambers (AEROCHAMBER PLUS) inhaler    Sig: Use as instructed    Dispense:  1 each    Refill:  2   HYDROcodone-acetaminophen (NORCO/VICODIN) 5-325 MG tablet    Sig: Take 1-2 tablets by mouth every 6 (six) hours as needed for moderate pain.    Dispense:  12 tablet    Refill:  0   tamsulosin (FLOMAX) 0.4 MG CAPS capsule    Sig: Take 1 capsule (0.4 mg total) by mouth at bedtime for 7 days.    Dispense:  7 capsule    Refill:  0      *This clinic note was created using [employer]. Therefore, there may be occasional mistakes despite careful proofreading.  ?    [name], MD 04/08/21 [phone]

## 2021-04-07 NOTE — ED Triage Notes (Signed)
Fatigue on Friday and productive cough with green sputum.  Low grade fevers for a few days.  Nasal congestion and ears feel clogged.    Also c/o lower back pain.  States he may be passing a kidney stone.  Back pain started 2 nights ago.

## 2021-04-08 LAB — COVID-19, FLU A+B NAA
Influenza A, NAA: NOT DETECTED
Influenza B, NAA: NOT DETECTED
SARS-CoV-2, NAA: NOT DETECTED

## 2021-07-05 ENCOUNTER — Other Ambulatory Visit: Payer: Self-pay

## 2021-07-05 ENCOUNTER — Encounter (HOSPITAL_COMMUNITY): Payer: Self-pay

## 2021-07-05 ENCOUNTER — Emergency Department (HOSPITAL_COMMUNITY)
Admission: EM | Admit: 2021-07-05 | Discharge: 2021-07-06 | Disposition: A | Discharge status: Home / Self Care | Payer: 59 | Attending: Emergency Medicine | Admitting: Emergency Medicine

## 2021-07-05 DIAGNOSIS — R42 Dizziness and giddiness: Secondary | ICD-10-CM | POA: Diagnosis not present

## 2021-07-05 DIAGNOSIS — Z79899 Other long term (current) drug therapy: Secondary | ICD-10-CM | POA: Insufficient documentation

## 2021-07-05 DIAGNOSIS — R109 Unspecified abdominal pain: Secondary | ICD-10-CM | POA: Insufficient documentation

## 2021-07-05 DIAGNOSIS — K625 Hemorrhage of anus and rectum: Secondary | ICD-10-CM | POA: Diagnosis not present

## 2021-07-05 DIAGNOSIS — R21 Rash and other nonspecific skin eruption: Secondary | ICD-10-CM | POA: Insufficient documentation

## 2021-07-05 DIAGNOSIS — M6281 Muscle weakness (generalized): Secondary | ICD-10-CM | POA: Diagnosis not present

## 2021-07-05 LAB — CBC WITH DIFFERENTIAL/PLATELET
Abs Immature Granulocytes: 0.02 10*3/uL (ref 0.00–0.07)
Basophils Absolute: 0.1 10*3/uL (ref 0.0–0.1)
Basophils Relative: 1 %
Eosinophils Absolute: 0.5 10*3/uL (ref 0.0–0.5)
Eosinophils Relative: 6 %
HCT: 45.1 % (ref 39.0–52.0)
Hemoglobin: 15.7 g/dL (ref 13.0–17.0)
Immature Granulocytes: 0 %
Lymphocytes Relative: 18 %
Lymphs Abs: 1.5 10*3/uL (ref 0.7–4.0)
MCH: 32.2 pg (ref 26.0–34.0)
MCHC: 34.8 g/dL (ref 30.0–36.0)
MCV: 92.4 fL (ref 80.0–100.0)
Monocytes Absolute: 0.6 10*3/uL (ref 0.1–1.0)
Monocytes Relative: 7 %
Neutro Abs: 5.5 10*3/uL (ref 1.7–7.7)
Neutrophils Relative %: 68 %
Platelets: 357 10*3/uL (ref 150–400)
RBC: 4.88 MIL/uL (ref 4.22–5.81)
RDW: 12.3 % (ref 11.5–15.5)
WBC: 8.1 10*3/uL (ref 4.0–10.5)
nRBC: 0 % (ref 0.0–0.2)

## 2021-07-05 LAB — I-STAT CHEM 8, ED
BUN: 15 mg/dL (ref 6–20)
Calcium, Ion: 1.22 mmol/L (ref 1.15–1.40)
Chloride: 103 mmol/L (ref 98–111)
Creatinine, Ser: 1 mg/dL (ref 0.61–1.24)
Glucose, Bld: 91 mg/dL (ref 70–99)
HCT: 47 % (ref 39.0–52.0)
Hemoglobin: 16 g/dL (ref 13.0–17.0)
Potassium: 4.2 mmol/L (ref 3.5–5.1)
Sodium: 138 mmol/L (ref 135–145)
TCO2: 27 mmol/L (ref 22–32)

## 2021-07-05 LAB — TYPE AND SCREEN
ABO/RH(D): O POS
Antibody Screen: NEGATIVE

## 2021-07-05 LAB — BASIC METABOLIC PANEL
Anion gap: 7 (ref 5–15)
BUN: 13 mg/dL (ref 6–20)
CO2: 26 mmol/L (ref 22–32)
Calcium: 9.7 mg/dL (ref 8.9–10.3)
Chloride: 104 mmol/L (ref 98–111)
Creatinine, Ser: 0.98 mg/dL (ref 0.61–1.24)
GFR, Estimated: >60 mL/min (ref >60)
Glucose, Bld: 95 mg/dL (ref 70–99)
Potassium: 4.2 mmol/L (ref 3.5–5.1)
Sodium: 137 mmol/L (ref 135–145)

## 2021-07-05 NOTE — ED Triage Notes (Signed)
Hx of Ulcerative colitis and has been off meds for 1 year. Complaining of lightheadedness, abd pain, +blood in stool.  Also complains of rash on body that he can't get rid of.  ?

## 2021-07-05 NOTE — ED Provider Triage Note (Signed)
Emergency Medicine Provider Triage Evaluation Note ? ?Gregory Clarke , a 26 y.o. male  was evaluated in triage.  Pt complains of blood in stool, fatigue, lightheadedness, and shortness of breath. ? ?Patient reports that he has a history of ulcerative colitis.  Has been off any medications for UC over the last year.  Patient states that he will have flares of UC in which she sees blood in the stool.  Patient has noticed blood in his stool over the last week.  Describes blood as bright red in color.  Over this time patient has developed fatigue, lightheadedness, and shortness of breath.  Patient reports that he has been using his prescribed inhaler to help with the shortness of breath.  Patient reports that he has lower abdominal pain after eating.  No pain at present. ? ?Denies any fever, chills, melena, dysuria, hematuria, urinary urgency, chest pain, syncope. ? ?Endorses marijuana use.  Denies any illicit drug use. ? ?Review of Systems  ?Positive: Blood in stool, fatigue, lightheadedness, shortness of breath ?Negative: See above ? ?Physical Exam  ?There were no vitals taken for this visit. ?Gen:   Awake, no distress   ?Resp:  Normal effort, clear to auscultation bilaterally.  Speaks in full complete sentences without difficulty. ?MSK:   Moves extremities without difficulty  ?Other:  Abdomen soft, nondistended, nontender with no guarding or rebound tenderness. ? ?Medical Decision Making  ?Medically screening exam initiated at 4:36 PM.  Appropriate orders placed.  Gregory Clarke was informed that the remainder of the evaluation will be completed by another provider, this initial triage assessment does not replace that evaluation, and the importance of remaining in the ED until their evaluation is complete. ? ? ?  ?Loni Beckwith, PA-C ?07/05/21 1638 ? ?

## 2021-07-06 ENCOUNTER — Telehealth: Payer: Self-pay | Admitting: Internal Medicine

## 2021-07-06 ENCOUNTER — Emergency Department (HOSPITAL_COMMUNITY): Payer: 59

## 2021-07-06 MED ORDER — PREDNISONE 20 MG PO TABS: ORAL_TABLET | ORAL | 0 refills | Status: DC

## 2021-07-06 MED ORDER — ALBUTEROL SULFATE HFA 108 (90 BASE) MCG/ACT IN AERS
4.0000 | INHALATION_SPRAY | Freq: Once | RESPIRATORY_TRACT | Status: AC
  Administered 2021-07-06: 4 via RESPIRATORY_TRACT
  Filled 2021-07-06: qty 6.7

## 2021-07-06 MED ORDER — DEXAMETHASONE SODIUM PHOSPHATE 10 MG/ML IJ SOLN
10.0000 mg | Freq: Once | INTRAMUSCULAR | Status: AC
  Administered 2021-07-06: 10 mg via INTRAVENOUS
  Filled 2021-07-06: qty 1

## 2021-07-06 MED ORDER — HYDROXYZINE HCL 25 MG PO TABS: 25.0000 mg | ORAL_TABLET | Freq: Four times a day (QID) | ORAL | 0 refills | Status: DC

## 2021-07-06 MED ORDER — IOHEXOL 300 MG/ML  SOLN
100.0000 mL | Freq: Once | INTRAMUSCULAR | Status: AC | PRN
  Administered 2021-07-06: 100 mL via INTRAVENOUS

## 2021-07-06 NOTE — ED Notes (Signed)
Patient complaining of difficulty breathing. Respirations clear and unlabored. Patient with some redness to arms and chest. Patient states he woke up itchy and was scratching. Patient reports "this happened the other day at work too". Provider notified.  ?

## 2021-07-06 NOTE — Telephone Encounter (Signed)
Request received to transfer GI care from outside practice to [facility].  We appreciate the interest in our practice, however at this time due to high demand from patients without established GI providers we cannot accommodate this transfer.  Ability to accommodate future transfer requests may change over time and the patient can contact us again in 6-12 months if still interested in being seen at [facility].      °

## 2021-07-06 NOTE — Telephone Encounter (Signed)
Good morning Dr. Carlean Purl! ? ?(DoD 07/06/21 a.m.) ? ?This patient was at the ER at [facility] yesterday for abdominal pain and they referred him to [place] for follow up.  He is requesting a transfer of care to our facility.  He has been seen in the past at [facility].  The records are in [place] for your review. ? ?Please let me know if you approve the transfer. ? ?Thank you. ?

## 2021-07-06 NOTE — ED Provider Notes (Signed)
?[facility] ?Provider Note ? ? ?CSN: [id number] ?Arrival date & time: 07/05/21  1608 ? ?  ? ?History ? ?Chief Complaint  ?Patient presents with  ? Dizziness  ? ? ?Gregory Clarke is a 26 y.o. male. ? ?Patient presents to the emergency department for evaluation of generalized weakness, dizziness, abdominal pain, rectal bleeding.  Patient reports that he was diagnosed with ulcerative colitis 2 years ago.  He has been off of his sulfasalazine and has been lost to follow-up with his GI doctor.  Patient reports that he has had fairly severe anemia secondary to his UC in the past and this feels similar. ? ?Patient also complaining of chronic rash.  This has been present for about the same amount of time as his UC diagnosis.  He got better when he was on prednisone but it never completely went away.  Patient reports increased rash on extremities with severe itching at night. ? ? ?  ? ?Home Medications ?Prior to Admission medications   ?Medication Sig Start Date End Date Taking? Authorizing Provider  ?albuterol (VENTOLIN HFA) 108 (90 Base) MCG/ACT inhaler Inhale 1-2 puffs into the lungs every 6 (six) hours as needed for wheezing or shortness of breath. 01/09/21  Yes [provider]  ?diphenhydrAMINE (BENADRYL) 25 MG tablet Take 25 mg by mouth every 6 (six) hours as needed for allergies.   Yes [provider]  ?hydrOXYzine (ATARAX) 25 MG tablet Take 1 tablet (25 mg total) by mouth every 6 (six) hours. 07/06/21  Yes Lavante Toso, [name], MD  ?ibuprofen (ADVIL) 200 MG tablet Take 800-1,000 mg by mouth every 6 (six) hours as needed for headache or moderate pain.   Yes [provider]  ?predniSONE (DELTASONE) 20 MG tablet 3 tabs po daily x 3 days, then 2 tabs x 3 days, then 1.5 tabs x 3 days, then 1 tab x 3 days, then 0.5 tabs x 3 days 07/06/21  Yes [name], [name], MD  ?Probiotic Product (PROBIOTIC DAILY) CAPS Take 1 capsule by mouth daily.   Yes  [provider]  ?fluticasone (FLONASE) 50 MCG/ACT nasal spray Place 2 sprays into both nostrils daily. ?Patient not taking: Reported on 07/06/2021 04/07/21   [name], MD  ?Spacer/Aero-Holding Chambers (AEROCHAMBER PLUS) inhaler Use as instructed 04/07/21   [name], MD  ?   ? ?Allergies    ?Patient has no known allergies.   ? ?Review of Systems   ?Review of Systems  ?Constitutional:  Positive for fatigue.  ?Gastrointestinal:  Positive for abdominal pain and blood in stool.  ?Skin:  Positive for rash.  ?Neurological:  Positive for dizziness.  ? ?Physical Exam ?Updated Vital Signs ?BP 124/74 (BP Location: Left Arm)   Pulse (!) 57   Temp 97.8 ?F (36.6 ?C) (Oral)   Resp 16   Ht 6' 2"  (1.88 m)   Wt 95.3 kg   SpO2 100%   BMI 26.96 kg/m?  ?Physical Exam ?Vitals and nursing note reviewed.  ?Constitutional:   ?   General: He is not in acute distress. ?   Appearance: He is well-developed.  ?HENT:  ?   Head: Normocephalic and atraumatic.  ?   Mouth/Throat:  ?   Mouth: Mucous membranes are moist.  ?Eyes:  ?   General: Vision grossly intact. Gaze aligned appropriately.  ?   Extraocular Movements: Extraocular movements intact.  ?   Conjunctiva/sclera: Conjunctivae normal.  ?Cardiovascular:  ?   Rate and Rhythm: Normal rate and regular  rhythm.  ?   Pulses: Normal pulses.  ?   Heart sounds: Normal heart sounds, S1 normal and S2 normal. No murmur heard. ?  No friction rub. No gallop.  ?Pulmonary:  ?   Effort: Pulmonary effort is normal. No respiratory distress.  ?   Breath sounds: Normal breath sounds.  ?Abdominal:  ?   Palpations: Abdomen is soft.  ?   Tenderness: There is abdominal tenderness. There is no guarding or rebound.  ?   Hernia: No hernia is present.  ?Musculoskeletal:     ?   General: No swelling.  ?   Cervical back: Full passive range of motion without pain, normal range of motion and neck supple. No pain with movement, spinous process tenderness or muscular tenderness. Normal range of  motion.  ?   Right lower leg: No edema.  ?   Left lower leg: No edema.  ?Skin: ?   General: Skin is warm and dry.  ?   Capillary Refill: Capillary refill takes less than 2 seconds.  ?   Findings: Rash (Diffuse raised, erythematous sandpapery rash with excoriations.) present. No ecchymosis, erythema, lesion or wound.  ?Neurological:  ?   Mental Status: He is alert and oriented to person, place, and time.  ?   GCS: GCS eye subscore is 4. GCS verbal subscore is 5. GCS motor subscore is 6.  ?   Cranial Nerves: Cranial nerves 2-12 are intact.  ?   Sensory: Sensation is intact.  ?   Motor: Motor function is intact. No weakness or abnormal muscle tone.  ?   Coordination: Coordination is intact.  ?Psychiatric:     ?   Mood and Affect: Mood normal.     ?   Speech: Speech normal.     ?   Behavior: Behavior normal.  ? ? ?ED Results / Procedures / Treatments   ?Labs ?(all labs ordered are listed, but only abnormal results are displayed) ?Labs Reviewed  ?BASIC METABOLIC PANEL  ?CBC WITH DIFFERENTIAL/PLATELET  ?I-STAT CHEM 8, ED  ?TYPE AND SCREEN  ? ? ?EKG ?None ? ?Radiology ?CT ABDOMEN PELVIS W CONTRAST ? ?Result Date: 07/06/2021 ?CLINICAL DATA:  26 year old male with ulcerative colitis, off medication for 1 year. Blood in stool and abdominal pain. EXAM: CT ABDOMEN AND PELVIS WITH CONTRAST TECHNIQUE: Multidetector CT imaging of the abdomen and pelvis was performed using the standard protocol following bolus administration of intravenous contrast. RADIATION DOSE REDUCTION: This exam was performed according to the departmental dose-optimization program which includes automated exposure control, adjustment of the mA and/or kV according to patient size and/or use of iterative reconstruction technique. CONTRAST:  145m OMNIPAQUE IOHEXOL 300 MG/ML  SOLN COMPARISON:  Noncontrast CT Abdomen and Pelvis 06/21/2020. CT Abdomen and Pelvis with contrast 08/29/2019. FINDINGS: Lower chest: Negative. Hepatobiliary: Negative liver and  gallbladder. Pancreas: Negative. Spleen: Negative. Adrenals/Urinary Tract: Nephrolithiasis demonstrated on the noncontrast exam last year is less apparent with contrast now (left upper pole coronal image 104). Nonobstructed kidneys. Occasional tiny benign cortical cysts which have been present since at least 2021 (no follow-up imaging recommended). Normal adrenal glands. Decompressed ureters. Unremarkable bladder. Stomach/Bowel: Distal colon appears largely normal. Mild redundancy of the sigmoid. No featureless loops in the pelvis. Likewise, descending and transverse colon are within normal limits. Right colon and appendix are normal (series 3, image 62). No dilated small bowel. Terminal ileum and other small bowel loops are within normal limits. Decompressed stomach. No free air, free fluid, or mesenteric inflammation identified. Vascular/Lymphatic:  Visible major arterial structures appear patent and normal. The main portal vein is patent. Early portal venous system contrast timing otherwise. No lymphadenopathy identified. Reproductive: Negative. Other: No pelvic free fluid. Musculoskeletal: Occasional chronic vertebral body endplate Schmorl's nodes, including at L5. No acute osseous abnormality identified. IMPRESSION: 1. Largely normal CT appearance of the abdomen and pelvis. No active inflammatory bowel disease identified. 2. Nephrolithiasis, better demonstrated on the noncontrast exam last year, with no obstructive uropathy. Electronically Signed   By: Genevie Ann M.D.   On: 07/06/2021 04:50   ? ?Procedures ?Procedures  ? ? ?Medications Ordered in ED ?Medications  ?iohexol (OMNIPAQUE) 300 MG/ML solution 100 mL (100 mLs Intravenous Contrast Given 07/06/21 0416)  ?dexamethasone (DECADRON) injection 10 mg (10 mg Intravenous Given 07/06/21 0558)  ?albuterol (VENTOLIN HFA) 108 (90 Base) MCG/ACT inhaler 4 puff (4 puffs Inhalation Given 07/06/21 0557)  ? ? ?ED Course/ Medical Decision Making/ A&P ?  ?                         ?Medical Decision Making ?Amount and/or Complexity of Data Reviewed ?Radiology: ordered. ? ?Risk ?Prescription drug management. ? ? ?Patient presents with multiple complaints.  Patient's primary complaint is dizziness which she

## 2021-07-07 NOTE — Telephone Encounter (Signed)
Patient notified of message below.

## 2021-08-11 ENCOUNTER — Ambulatory Visit
Admission: RE | Admit: 2021-08-11 | Discharge: 2021-08-11 | Disposition: A | Discharge status: Home / Self Care | Payer: 59 | Source: Ambulatory Visit | Attending: Family Medicine | Admitting: Family Medicine

## 2021-08-11 VITALS — BP 122/83 | HR 90 | Temp 97.7°F | Resp 20

## 2021-08-11 DIAGNOSIS — L209 Atopic dermatitis, unspecified: Secondary | ICD-10-CM | POA: Diagnosis not present

## 2021-08-11 MED ORDER — CHLORHEXIDINE GLUCONATE 4 % EX LIQD: Freq: Every day | CUTANEOUS | 0 refills | Status: DC | PRN

## 2021-08-11 MED ORDER — TRIAMCINOLONE ACETONIDE 0.1 % EX CREA: 1.0000 "application " | TOPICAL_CREAM | Freq: Two times a day (BID) | CUTANEOUS | 2 refills | Status: DC

## 2021-08-11 NOTE — ED Triage Notes (Signed)
Pt states for about 2 years he has had some bad skin rashes all over his body  Pt states he has been told it may be eczema   Pt states he has an appointment with Dermatologist in October  Pt states the rash itches and burns

## 2021-08-11 NOTE — ED Provider Notes (Signed)
[facility]    CSN: [id number] Arrival date & time: 08/11/21  1043      History   Chief Complaint Chief Complaint  Patient presents with   Rash    Full body rash - Entered by patient    HPI Gregory Clarke is a 26 y.o. male.   Presenting today with 2-year history of waxing and waning itching, burning red rash worse in the creases of his elbows and knees but extends across most of his body.  He states it clears on steroid courses but flares right back up after completion.  Has been using numerous unscented moisturizers, oatmeal baths and other home remedies and has switched to all unscented products at home with no relief.  Has an appointment scheduled with dermatology but it is not until October.  Denies any new medications, new foods, new home exposures.  Past Medical History:  Diagnosis Date   Anemia    Asthma    History of kidney stones    Kidney stones 03/19/2019   Ulcerative colitis [facility])    Patient Active Problem List   Diagnosis Date Noted   Rash and nonspecific skin eruption 01/11/2020   Ulcerative colitis ([place]) 07/23/2019   Bronchial asthma 07/23/2019   Mesenteric lymphadenopathy 03/19/2019   Iron deficiency anemia due to chronic blood loss 03/17/2019   IDA (iron deficiency anemia) 03/16/2019   Bloody diarrhea 03/16/2019   Foot fracture 09/09/2014   Past Surgical History:  Procedure Laterality Date   BIOPSY  03/31/2019   Procedure: BIOPSY;  Surgeon: Rogene Houston, MD;  [facility];  Service: Endoscopy;;   COLONOSCOPY WITH PROPOFOL N/A 03/31/2019   Procedure: COLONOSCOPY WITH PROPOFOL;  Surgeon: Rogene Houston, MD;  [facility];  Service: Endoscopy;  Laterality: N/A;  7:30   TONSILLECTOMY       Home Medications    Prior to Admission medications   Medication Sig Start Date End Date Taking? Authorizing Provider  chlorhexidine (HIBICLENS) 4 % external liquid Apply topically daily as needed. 08/11/21  Yes Volney American, PA-C  triamcinolone cream (KENALOG) 0.1 % Apply 1 application. topically 2 (two) times daily. 08/11/21  Yes Volney American, PA-C  albuterol (VENTOLIN HFA) 108 (90 Base) MCG/ACT inhaler Inhale 1-2 puffs into the lungs every 6 (six) hours as needed for wheezing or shortness of breath. 01/09/21   [provider]  diphenhydrAMINE (BENADRYL) 25 MG tablet Take 25 mg by mouth every 6 (six) hours as needed for allergies.    [provider]  fluticasone (FLONASE) 50 MCG/ACT nasal spray Place 2 sprays into both nostrils daily. Patient not taking: Reported on 07/06/2021 04/07/21   [name], MD  hydrOXYzine (ATARAX) 25 MG tablet Take 1 tablet (25 mg total) by mouth every 6 (six) hours. 07/06/21   Orpah Greek, MD  ibuprofen (ADVIL) 200 MG tablet Take 800-1,000 mg by mouth every 6 (six) hours as needed for headache or moderate pain.    [provider]  predniSONE (DELTASONE) 20 MG tablet 3 tabs po daily x 3 days, then 2 tabs x 3 days, then 1.5 tabs x 3 days, then 1 tab x 3 days, then 0.5 tabs x 3 days 07/06/21   Orpah Greek, MD  Probiotic Product (PROBIOTIC DAILY) CAPS Take 1 capsule by mouth daily.    [provider]  Spacer/Aero-Holding Chambers (AEROCHAMBER PLUS) inhaler Use as instructed 04/07/21   [name], MD   Family History Family History  Problem Relation Age of Onset   Healthy Mother    Healthy Father    Healthy Sister    Healthy Brother     Social History Social History   Tobacco Use   Smoking status: Never   Smokeless tobacco: Never  Vaping Use   Vaping Use: Former  Substance Use Topics   Alcohol use: No   Drug use: Yes    Types: Marijuana    Comment: 03/26/19    Allergies   Patient has no known allergies.   Review of Systems Review of Systems Per HPI  Physical Exam Triage Vital Signs ED Triage Vitals  Enc Vitals Group     BP 08/11/21 1115 122/83     Pulse Rate 08/11/21 1115 90      Resp 08/11/21 1115 20     Temp 08/11/21 1115 97.7 F (36.5 C)     Temp Source 08/11/21 1115 Oral     SpO2 08/11/21 1115 94 %     Weight --      Height --      Head Circumference --      Peak Flow --      Pain Score 08/11/21 1112 7     Pain Loc --      Pain Edu? --      Excl. in [place]? --    No data found.  Updated Vital Signs BP 122/83 (BP Location: Right Arm)   Pulse 90   Temp 97.7 F (36.5 C) (Oral)   Resp 20   SpO2 94%   Visual Acuity Right Eye Distance:   Left Eye Distance:   Bilateral Distance:    Right Eye Near:   Left Eye Near:    Bilateral Near:     Physical Exam Vitals and nursing note reviewed.  Constitutional:      Appearance: Normal appearance.  HENT:     Head: Atraumatic.  Eyes:     Extraocular Movements: Extraocular movements intact.     Conjunctiva/sclera: Conjunctivae normal.  Cardiovascular:     Rate and Rhythm: Normal rate and regular rhythm.  Pulmonary:     Effort: Pulmonary effort is normal.     Breath sounds: Normal breath sounds.  Musculoskeletal:        General: Normal range of motion.     Cervical back: Normal range of motion and neck supple.  Skin:    General: Skin is warm and dry.     Findings: Rash present.     Comments: Erythematous peeling and thickened rashes flexural surfaces of elbows, knees as well as sporadic areas of scabbing, dryness, erythema across upper extremities, torso, face  Neurological:     General: No focal deficit present.     Mental Status: He is oriented to person, place, and time.  Psychiatric:        Mood and Affect: Mood normal.        Thought Content: Thought content normal.        Judgment: Judgment normal.     UC Treatments / Results  Labs (all labs ordered are listed, but only abnormal results are displayed) Labs Reviewed - No data to display  EKG   Radiology No results found.  Procedures Procedures (including critical care time)  Medications Ordered in UC Medications - No data to  display  Initial Impression / Assessment and Plan / UC Course  I have reviewed the triage vital signs and the nursing notes.  Pertinent labs & imaging results that were available  during my care of the patient were reviewed by me and considered in my medical decision making (see chart for details).     Consistent with significant atopic dermatitis, treat with triamcinolone cream, Hibiclens in the areas of scabbing and ulceration, continue good moisturizer regimen and unscented products.  Follow-up as scheduled dermatology.  Final Clinical Impressions(s) / UC Diagnoses   Final diagnoses:  Atopic dermatitis, unspecified type   Discharge Instructions   None    ED Prescriptions     Medication Sig Dispense Auth. Provider   triamcinolone cream (KENALOG) 0.1 % Apply 1 application. topically 2 (two) times daily. 453.6 g Volney American, PA-C   chlorhexidine (HIBICLENS) 4 % external liquid Apply topically daily as needed. 120 mL Volney American, [place]      PDMP not reviewed this encounter.   Volney American, [place] 08/11/21 1154

## 2021-09-15 ENCOUNTER — Ambulatory Visit
Admission: RE | Admit: 2021-09-15 | Discharge: 2021-09-15 | Disposition: A | Discharge status: Home / Self Care | Payer: 59 | Source: Ambulatory Visit | Attending: Nurse Practitioner | Admitting: Nurse Practitioner

## 2021-09-15 ENCOUNTER — Other Ambulatory Visit: Payer: Self-pay

## 2021-09-15 VITALS — BP 114/74 | HR 74 | Temp 98.1°F | Resp 20

## 2021-09-15 DIAGNOSIS — L089 Local infection of the skin and subcutaneous tissue, unspecified: Secondary | ICD-10-CM

## 2021-09-15 MED ORDER — DOXYCYCLINE HYCLATE 100 MG PO TABS: 100.0000 mg | ORAL_TABLET | Freq: Two times a day (BID) | ORAL | 0 refills | Status: DC

## 2021-09-15 NOTE — ED Triage Notes (Addendum)
Pt reports had new tattoo placed x 5 days ago and reports chills, redness and pain to right knee/thigh ever since.

## 2021-09-15 NOTE — ED Provider Notes (Signed)
[facility]    CSN: [id number] Arrival date & time: 09/15/21  1108      History   Chief Complaint Chief Complaint  Patient presents with   Wound Check    Possible infected tattoo - Entered by patient    HPI Gregory Clarke is a 26 y.o. male.   The history is provided by the patient.    Patient presents for suspected infection after a new tattoo was placed approximately 5 days ago.  Tattoo was placed to the right lower extremity.  Patient states after he had the tattoo placed, he had to work, requiring him to wear long pants which sheared across the new tattoo.  He states since that time he has developed drainage, crusting, swelling, and pain in the right lower extremity.  He states that he also had an episode of chills.  Denies fever, headache, chest pain, nausea, vomiting, or diarrhea.  States he has been using A&E ointment to the site.  Past Medical History:  Diagnosis Date   Anemia    Asthma    History of kidney stones    Kidney stones 03/19/2019   Ulcerative colitis [facility])     Patient Active Problem List   Diagnosis Date Noted   Rash and nonspecific skin eruption 01/11/2020   Ulcerative colitis ([place]) 07/23/2019   Bronchial asthma 07/23/2019   Mesenteric lymphadenopathy 03/19/2019   Iron deficiency anemia due to chronic blood loss 03/17/2019   IDA (iron deficiency anemia) 03/16/2019   Bloody diarrhea 03/16/2019   Foot fracture 09/09/2014    Past Surgical History:  Procedure Laterality Date   BIOPSY  03/31/2019   Procedure: BIOPSY;  Surgeon: Rogene Houston, MD;  [facility];  Service: Endoscopy;;   COLONOSCOPY WITH PROPOFOL N/A 03/31/2019   Procedure: COLONOSCOPY WITH PROPOFOL;  Surgeon: Rogene Houston, MD;  [facility];  Service: Endoscopy;  Laterality: N/A;  7:30   TONSILLECTOMY         Home Medications    Prior to Admission medications   Medication Sig Start Date End Date Taking? Authorizing Provider  doxycycline  (VIBRA-TABS) 100 MG tablet Take 1 tablet (100 mg total) by mouth 2 (two) times daily. 09/15/21  Yes Artur Winningham-Warren, Alda Lea, NP  albuterol (VENTOLIN HFA) 108 (90 Base) MCG/ACT inhaler Inhale 1-2 puffs into the lungs every 6 (six) hours as needed for wheezing or shortness of breath. 01/09/21   [provider]  chlorhexidine (HIBICLENS) 4 % external liquid Apply topically daily as needed. 08/11/21   Volney American, PA-C  diphenhydrAMINE (BENADRYL) 25 MG tablet Take 25 mg by mouth every 6 (six) hours as needed for allergies.    [provider]  fluticasone (FLONASE) 50 MCG/ACT nasal spray Place 2 sprays into both nostrils daily. Patient not taking: Reported on 07/06/2021 04/07/21   [name], MD  hydrOXYzine (ATARAX) 25 MG tablet Take 1 tablet (25 mg total) by mouth every 6 (six) hours. 07/06/21   Orpah Greek, MD  ibuprofen (ADVIL) 200 MG tablet Take 800-1,000 mg by mouth every 6 (six) hours as needed for headache or moderate pain.    [provider]  predniSONE (DELTASONE) 20 MG tablet 3 tabs po daily x 3 days, then 2 tabs x 3 days, then 1.5 tabs x 3 days, then 1 tab x 3 days, then 0.5 tabs x 3 days 07/06/21   Orpah Greek, MD  Probiotic Product (PROBIOTIC DAILY) CAPS Take 1 capsule by mouth daily.  [provider]  Spacer/Aero-Holding Chambers (AEROCHAMBER PLUS) inhaler Use as instructed 04/07/21   [name], MD  triamcinolone cream (KENALOG) 0.1 % Apply 1 application. topically 2 (two) times daily. 08/11/21   Volney American, PA-C    Family History Family History  Problem Relation Age of Onset   Healthy Mother    Healthy Father    Healthy Sister    Healthy Brother     Social History Social History   Tobacco Use   Smoking status: Never   Smokeless tobacco: Never  Vaping Use   Vaping Use: Former  Substance Use Topics   Alcohol use: No   Drug use: Yes    Types: Marijuana    Comment: 03/26/19      Allergies   Patient has no known allergies.   Review of Systems Review of Systems Per HPI  Physical Exam Triage Vital Signs ED Triage Vitals [09/15/21 1141]  Enc Vitals Group     BP 114/74     Pulse Rate 74     Resp 20     Temp 98.1 F (36.7 C)     Temp Source Oral     SpO2 96 %     Weight      Height      Head Circumference      Peak Flow      Pain Score 6     Pain Loc      Pain Edu?      Excl. in [place]?    No data found.  Updated Vital Signs BP 114/74 (BP Location: Right Arm)   Pulse 74   Temp 98.1 F (36.7 C) (Oral)   Resp 20   SpO2 96%   Visual Acuity Right Eye Distance:   Left Eye Distance:   Bilateral Distance:    Right Eye Near:   Left Eye Near:    Bilateral Near:     Physical Exam Vitals and nursing note reviewed.  Constitutional:      General: He is not in acute distress.    Appearance: Normal appearance.  HENT:     Head: Normocephalic.  Eyes:     Extraocular Movements: Extraocular movements intact.     Pupils: Pupils are equal, round, and reactive to light.  Cardiovascular:     Rate and Rhythm: Normal rate and regular rhythm.     Pulses: Normal pulses.     Heart sounds: Normal heart sounds.  Pulmonary:     Effort: Pulmonary effort is normal.     Breath sounds: Normal breath sounds.  Abdominal:     General: Bowel sounds are normal.     Palpations: Abdomen is soft.  Musculoskeletal:     Cervical back: Normal range of motion.  Lymphadenopathy:     Cervical: No cervical adenopathy.  Skin:    General: Skin is warm and dry.     Comments: See attached image.  Existing tattoo is warm to palpation.  There are areas of crusting in the lettering of the tattoo.  Some areas have faded due to scabbing.  Tattoo is shiny.  Neurological:     General: No focal deficit present.     Mental Status: He is alert and oriented to person, place, and time.  Psychiatric:        Mood and Affect: Mood normal.        Behavior: Behavior normal.                 UC Treatments /  Results  Labs (all labs ordered are listed, but only abnormal results are displayed) Labs Reviewed - No data to display  EKG   Radiology No results found.  Procedures Procedures (including critical care time)  Medications Ordered in UC Medications - No data to display  Initial Impression / Assessment and Plan / UC Course  I have reviewed the triage vital signs and the nursing notes.  Pertinent labs & imaging results that were available during my care of the patient were reviewed by me and considered in my medical decision making (see chart for details).  Patient presents for a wound check after a tattoo was placed approximately 5 days ago.  On exam, patient has erythema around the existing tattoo.  The area is crusting over from previous drainage.  Area is warm to palpation.  We will cover patient with doxycycline for the next 10 days.  Patient advised to continue use of the A&E ointment he currently has.  Supportive care recommendations were provided to the patient along with indications of when to go to the emergency department.  Patient verbalizes understanding.  All questions were answered. Final Clinical Impressions(s) / UC Diagnoses   Final diagnoses:  Skin infection     Discharge Instructions      Take medication as prescribed. May take over-the-counter ibuprofen or Tylenol for pain, fever, or general discomfort. Apply cool compresses to help with redness and swelling of the right lower extremity. Do not pick or disrupt the scabbing or crusting of the areas. Keep the area clean and dry with warm water. When working, recommend covering the area with a bandage to prevent further irritation. Go to the emergency department immediately if you develop worsening fever, chills, generalized fatigue, foul-smelling drainage, increasing redness or streaking up the leg, abdominal pain, or other concerns.     ED Prescriptions      Medication Sig Dispense Auth. Provider   doxycycline (VIBRA-TABS) 100 MG tablet Take 1 tablet (100 mg total) by mouth 2 (two) times daily. 20 tablet Precious Segall-Warren, Alda Lea, NP      PDMP not reviewed this encounter.   Tish Men, NP 09/15/21 1249

## 2021-09-15 NOTE — Discharge Instructions (Signed)
Take medication as prescribed. May take over-the-counter ibuprofen or Tylenol for pain, fever, or general discomfort. Apply cool compresses to help with redness and swelling of the right lower extremity. Do not pick or disrupt the scabbing or crusting of the areas. Keep the area clean and dry with warm water. When working, recommend covering the area with a bandage to prevent further irritation. Go to the emergency department immediately if you develop worsening fever, chills, generalized fatigue, foul-smelling drainage, increasing redness or streaking up the leg, abdominal pain, or other concerns.

## 2021-09-27 ENCOUNTER — Encounter (HOSPITAL_COMMUNITY): Payer: Self-pay | Admitting: Hematology

## 2021-09-27 ENCOUNTER — Ambulatory Visit
Admission: RE | Admit: 2021-09-27 | Discharge: 2021-09-27 | Disposition: A | Discharge status: Home / Self Care | Payer: 59 | Source: Ambulatory Visit | Attending: Family Medicine | Admitting: Family Medicine

## 2021-09-27 VITALS — BP 127/80 | HR 78 | Temp 97.4°F | Resp 18

## 2021-09-27 DIAGNOSIS — L723 Sebaceous cyst: Secondary | ICD-10-CM | POA: Diagnosis not present

## 2021-09-27 MED ORDER — CEPHALEXIN 500 MG PO CAPS: 500.0000 mg | ORAL_CAPSULE | Freq: Two times a day (BID) | ORAL | 0 refills | Status: DC

## 2021-09-27 NOTE — ED Triage Notes (Signed)
Bump on head x 1 month.  States its painful and has drained.  Currently on doxycycline for infected tattoo.

## 2021-09-27 NOTE — ED Provider Notes (Signed)
[facility]    CSN: [id number] Arrival date & time: 09/27/21  0835      History   Chief Complaint Chief Complaint  Patient presents with   Abscess    Bump on head - Entered by patient    HPI Gregory Clarke is a 26 y.o. male.   Presenting today with a bump on the top of his head that has been present for over a month.  He states has been painful off and on, was able to get it to drain some thick pus at one point but not recently.  Denies discoloration, fever, chills, injury to the area.  States has been on doxycycline for the past week and a half for an infected tattoo on his leg.   Past Medical History:  Diagnosis Date   Anemia    Asthma    History of kidney stones    Kidney stones 03/19/2019   Ulcerative colitis [facility])     Patient Active Problem List   Diagnosis Date Noted   Rash and nonspecific skin eruption 01/11/2020   Ulcerative colitis ([place]) 07/23/2019   Bronchial asthma 07/23/2019   Mesenteric lymphadenopathy 03/19/2019   Iron deficiency anemia due to chronic blood loss 03/17/2019   IDA (iron deficiency anemia) 03/16/2019   Bloody diarrhea 03/16/2019   Foot fracture 09/09/2014    Past Surgical History:  Procedure Laterality Date   BIOPSY  03/31/2019   Procedure: BIOPSY;  Surgeon: Rogene Houston, MD;  [facility];  Service: Endoscopy;;   COLONOSCOPY WITH PROPOFOL N/A 03/31/2019   Procedure: COLONOSCOPY WITH PROPOFOL;  Surgeon: Rogene Houston, MD;  [facility];  Service: Endoscopy;  Laterality: N/A;  7:30   TONSILLECTOMY         Home Medications    Prior to Admission medications   Medication Sig Start Date End Date Taking? Authorizing Provider  cephALEXin (KEFLEX) 500 MG capsule Take 1 capsule (500 mg total) by mouth 2 (two) times daily. 09/27/21  Yes Volney American, PA-C  albuterol (VENTOLIN HFA) 108 (90 Base) MCG/ACT inhaler Inhale 1-2 puffs into the lungs every 6 (six) hours as needed for wheezing or  shortness of breath. 01/09/21   [provider]  chlorhexidine (HIBICLENS) 4 % external liquid Apply topically daily as needed. 08/11/21   Volney American, PA-C  diphenhydrAMINE (BENADRYL) 25 MG tablet Take 25 mg by mouth every 6 (six) hours as needed for allergies.    [provider]  doxycycline (VIBRA-TABS) 100 MG tablet Take 1 tablet (100 mg total) by mouth 2 (two) times daily. 09/15/21   Leath-Warren, Alda Lea, NP  fluticasone (FLONASE) 50 MCG/ACT nasal spray Place 2 sprays into both nostrils daily. Patient not taking: Reported on 07/06/2021 04/07/21   [name], MD  hydrOXYzine (ATARAX) 25 MG tablet Take 1 tablet (25 mg total) by mouth every 6 (six) hours. 07/06/21   Orpah Greek, MD  ibuprofen (ADVIL) 200 MG tablet Take 800-1,000 mg by mouth every 6 (six) hours as needed for headache or moderate pain.    [provider]  predniSONE (DELTASONE) 20 MG tablet 3 tabs po daily x 3 days, then 2 tabs x 3 days, then 1.5 tabs x 3 days, then 1 tab x 3 days, then 0.5 tabs x 3 days 07/06/21   Orpah Greek, MD  Probiotic Product (PROBIOTIC DAILY) CAPS Take 1 capsule by mouth daily.    [provider]  Spacer/Aero-Holding Chambers (AEROCHAMBER PLUS) inhaler Use  as instructed 04/07/21   [name], MD  triamcinolone cream (KENALOG) 0.1 % Apply 1 application. topically 2 (two) times daily. 08/11/21   Volney American, PA-C    Family History Family History  Problem Relation Age of Onset   Healthy Mother    Healthy Father    Healthy Sister    Healthy Brother     Social History Social History   Tobacco Use   Smoking status: Never   Smokeless tobacco: Never  Vaping Use   Vaping Use: Former  Substance Use Topics   Alcohol use: No   Drug use: Yes    Types: Marijuana    Comment: 03/26/19     Allergies   Patient has no known allergies.   Review of Systems Review of Systems Per HPI  Physical Exam Triage Vital  Signs ED Triage Vitals  Enc Vitals Group     BP 09/27/21 0846 127/80     Pulse Rate 09/27/21 0846 78     Resp 09/27/21 0846 18     Temp 09/27/21 0846 (!) 97.4 F (36.3 C)     Temp Source 09/27/21 0846 Oral     SpO2 09/27/21 0846 95 %     Weight --      Height --      Head Circumference --      Peak Flow --      Pain Score 09/27/21 0847 1     Pain Loc --      Pain Edu? --      Excl. in [place]? --    No data found.  Updated Vital Signs BP 127/80 (BP Location: Right Arm)   Pulse 78   Temp (!) 97.4 F (36.3 C) (Oral)   Resp 18   SpO2 95%   Visual Acuity Right Eye Distance:   Left Eye Distance:   Bilateral Distance:    Right Eye Near:   Left Eye Near:    Bilateral Near:     Physical Exam Vitals and nursing note reviewed.  Constitutional:      Appearance: Normal appearance.  HENT:     Head: Atraumatic.  Eyes:     Extraocular Movements: Extraocular movements intact.     Conjunctiva/sclera: Conjunctivae normal.  Cardiovascular:     Rate and Rhythm: Normal rate and regular rhythm.  Pulmonary:     Effort: Pulmonary effort is normal.     Breath sounds: Normal breath sounds.  Musculoskeletal:        General: Normal range of motion.     Cervical back: Normal range of motion and neck supple.  Skin:    General: Skin is warm and dry.     Comments: Nonerythematous sebaceous cyst present superior scalp.  Soft fluctuant, not indurated.  No active drainage.  Neurological:     General: No focal deficit present.     Mental Status: He is oriented to person, place, and time.  Psychiatric:        Mood and Affect: Mood normal.        Thought Content: Thought content normal.        Judgment: Judgment normal.      UC Treatments / Results  Labs (all labs ordered are listed, but only abnormal results are displayed) Labs Reviewed - No data to display  EKG   Radiology No results found.  Procedures Procedures (including critical care time)  Medications Ordered in  UC Medications - No data to display  Initial Impression / Assessment and  Plan / UC Course  I have reviewed the triage vital signs and the nursing notes.  Pertinent labs & imaging results that were available during my care of the patient were reviewed by me and considered in my medical decision making (see chart for details).     Sebaceous cyst, not currently infected so no indication for I&D.  Has already been on a 10-day course of doxycycline which she has several days left on.  We will provide a prescription for Keflex in case worsening after discontinuing the antibiotic, give resources for outpatient follow-up and already has a dermatology appointment scheduled.  Final Clinical Impressions(s) / UC Diagnoses   Final diagnoses:  Sebaceous cyst   Discharge Instructions   None    ED Prescriptions     Medication Sig Dispense Auth. Provider   cephALEXin (KEFLEX) 500 MG capsule Take 1 capsule (500 mg total) by mouth 2 (two) times daily. 14 capsule Volney American, [place]      PDMP not reviewed this encounter.   Volney American, [place] 09/27/21 1503

## 2021-10-13 ENCOUNTER — Ambulatory Visit
Admission: RE | Admit: 2021-10-13 | Discharge: 2021-10-13 | Disposition: A | Discharge status: Home / Self Care | Payer: 59 | Source: Ambulatory Visit | Attending: Nurse Practitioner | Admitting: Nurse Practitioner

## 2021-10-13 VITALS — BP 120/70 | HR 75 | Temp 97.5°F | Resp 18

## 2021-10-13 DIAGNOSIS — J4521 Mild intermittent asthma with (acute) exacerbation: Secondary | ICD-10-CM | POA: Diagnosis not present

## 2021-10-13 DIAGNOSIS — J309 Allergic rhinitis, unspecified: Secondary | ICD-10-CM

## 2021-10-13 MED ORDER — ALBUTEROL SULFATE HFA 108 (90 BASE) MCG/ACT IN AERS
1.0000 | INHALATION_SPRAY | Freq: Four times a day (QID) | RESPIRATORY_TRACT | 2 refills | Status: DC | PRN
Start: 2021-10-13 — End: 2023-02-24

## 2021-10-13 MED ORDER — FLUTICASONE PROPIONATE 50 MCG/ACT NA SUSP: 2.0000 | Freq: Every day | NASAL | 0 refills | Status: DC

## 2021-10-13 MED ORDER — DEXAMETHASONE SODIUM PHOSPHATE 10 MG/ML IJ SOLN
10.0000 mg | Freq: Once | INTRAMUSCULAR | Status: AC
  Administered 2021-10-13: 10 mg via INTRAMUSCULAR

## 2021-10-13 NOTE — ED Provider Notes (Signed)
[facility]    CSN: [id number] Arrival date & time: 10/13/21  1040      History   Chief Complaint Chief Complaint  Patient presents with   Wheezing    Asthma - Entered by patient   Appointment    1100    HPI Tania Perrott is a 26 y.o. male.   Patient presents for nasal congestion, wheezing, shortness of breath that has been worsening for the past 4 days.  Reports he ran out of his asthma inhaler 4 days ago.  He has an appointment with his PCP in about 1 month, however is going out of the country in a couple weeks and is requesting a refill of albuterol.  He reports occasional chest tightness that he associates with his asthma.  Denies fevers, significant cough, sore throat, abdominal pain, ear pain or drainage, sinus pain/pressure, headache, and nausea/vomiting.    Patient reports history of asthma, eczema, and allergic.  He takes an oral antihistamine for allergies, no nasal spray currently.  He reports he recently was treated for eczema flare with longer course of prednisone.      Past Medical History:  Diagnosis Date   Anemia    Asthma    History of kidney stones    Kidney stones 03/19/2019   Ulcerative colitis [facility])     Patient Active Problem List   Diagnosis Date Noted   Rash and nonspecific skin eruption 01/11/2020   Ulcerative colitis ([place]) 07/23/2019   Bronchial asthma 07/23/2019   Mesenteric lymphadenopathy 03/19/2019   Iron deficiency anemia due to chronic blood loss 03/17/2019   IDA (iron deficiency anemia) 03/16/2019   Bloody diarrhea 03/16/2019   Foot fracture 09/09/2014    Past Surgical History:  Procedure Laterality Date   BIOPSY  03/31/2019   Procedure: BIOPSY;  Surgeon: Rogene Houston, MD;  Location: [facility];  Service: Endoscopy;;   COLONOSCOPY WITH PROPOFOL N/A 03/31/2019   Procedure: COLONOSCOPY WITH PROPOFOL;  Surgeon: Rogene Houston, MD;  Location: [facility];  Service: Endoscopy;  Laterality: N/A;  7:30    TONSILLECTOMY         Home Medications    Prior to Admission medications   Medication Sig Start Date End Date Taking? Authorizing Provider  albuterol (VENTOLIN HFA) 108 (90 Base) MCG/ACT inhaler Inhale 1-2 puffs into the lungs every 6 (six) hours as needed for wheezing or shortness of breath. 10/13/21   Eulogio Bear, NP  chlorhexidine (HIBICLENS) 4 % external liquid Apply topically daily as needed. 08/11/21   Volney American, PA-C  diphenhydrAMINE (BENADRYL) 25 MG tablet Take 25 mg by mouth every 6 (six) hours as needed for allergies.    [provider]  fluticasone (FLONASE) 50 MCG/ACT nasal spray Place 2 sprays into both nostrils daily. 10/13/21   Eulogio Bear, NP  hydrOXYzine (ATARAX) 25 MG tablet Take 1 tablet (25 mg total) by mouth every 6 (six) hours. 07/06/21   Orpah Greek, MD  ibuprofen (ADVIL) 200 MG tablet Take 800-1,000 mg by mouth every 6 (six) hours as needed for headache or moderate pain.    [provider]  Probiotic Product (PROBIOTIC DAILY) CAPS Take 1 capsule by mouth daily.    [provider]  Spacer/Aero-Holding Chambers (AEROCHAMBER PLUS) inhaler Use as instructed 04/07/21   [name], MD  triamcinolone cream (KENALOG) 0.1 % Apply 1 application. topically 2 (two) times daily. 08/11/21   Volney American, PA-C    Family History Family  History  Problem Relation Age of Onset   Healthy Mother    Healthy Father    Healthy Sister    Healthy Brother     Social History Social History   Tobacco Use   Smoking status: Never   Smokeless tobacco: Never  Vaping Use   Vaping Use: Former  Substance Use Topics   Alcohol use: No   Drug use: Yes    Types: Marijuana    Comment: 03/26/19     Allergies   Patient has no known allergies.   Review of Systems Review of Systems Per HPI  Physical Exam Triage Vital Signs ED Triage Vitals  Enc Vitals Group     BP 10/13/21 1056 120/70     Pulse Rate  10/13/21 1056 75     Resp 10/13/21 1056 18     Temp 10/13/21 1056 (!) 97.5 F (36.4 C)     Temp Source 10/13/21 1056 Oral     SpO2 10/13/21 1056 99 %     Weight --      Height --      Head Circumference --      Peak Flow --      Pain Score 10/13/21 1054 0     Pain Loc --      Pain Edu? --      Excl. in [place]? --    No data found.  Updated Vital Signs BP 120/70 (BP Location: Right Arm)   Pulse 75   Temp (!) 97.5 F (36.4 C) (Oral)   Resp 18   SpO2 99%   Visual Acuity Right Eye Distance:   Left Eye Distance:   Bilateral Distance:    Right Eye Near:   Left Eye Near:    Bilateral Near:     Physical Exam Vitals and nursing note reviewed.  Constitutional:      General: He is not in acute distress.    Appearance: Normal appearance. He is not ill-appearing or toxic-appearing.  HENT:     Head: Normocephalic and atraumatic.     Right Ear: Tympanic membrane, ear canal and external ear normal.     Left Ear: Tympanic membrane, ear canal and external ear normal.     Nose: Congestion present. No rhinorrhea.     Mouth/Throat:     Mouth: Mucous membranes are moist.     Pharynx: Oropharynx is clear. No oropharyngeal exudate or posterior oropharyngeal erythema.  Eyes:     General: No scleral icterus.    Extraocular Movements: Extraocular movements intact.  Cardiovascular:     Rate and Rhythm: Normal rate and regular rhythm.  Pulmonary:     Effort: Pulmonary effort is normal. No respiratory distress.     Breath sounds: Normal breath sounds. No wheezing, rhonchi or rales.  Musculoskeletal:     Cervical back: Normal range of motion.  Lymphadenopathy:     Cervical: No cervical adenopathy.  Skin:    General: Skin is warm and dry.     Coloration: Skin is not jaundiced or pale.     Findings: No erythema or rash.  Neurological:     Mental Status: He is alert and oriented to person, place, and time.  Psychiatric:        Behavior: Behavior is cooperative.      UC Treatments /  Results  Labs (all labs ordered are listed, but only abnormal results are displayed) Labs Reviewed - No data to display  EKG   Radiology No results found.  Procedures  Procedures (including critical care time)  Medications Ordered in UC Medications  dexamethasone (DECADRON) injection 10 mg (10 mg Intramuscular Given 10/13/21 1111)    Initial Impression / Assessment and Plan / UC Course  I have reviewed the triage vital signs and the nursing notes.  Pertinent labs & imaging results that were available during my care of the patient were reviewed by me and considered in my medical decision making (see chart for details).    Patient is a very pleasant, well-appearing 26 year old male presenting for asthma with acute exacerbation and allergic rhinitis symptoms today.  We will give refill of albuterol inhaler and treat current exacerbation with dexamethasone 10 mg IM today in urgent care.  No wheezing appreciated on exam today, however patient reports wheezing at home.  No signs or symptoms of pneumonia today.  Additionally, add Flonase to allergic rhinitis regimen to help manage symptoms.  Discussed consultation with allergist if symptoms persist or worsen despite this treatment. Final Clinical Impressions(s) / UC Diagnoses   Final diagnoses:  Mild intermittent asthma with acute exacerbation  Allergic rhinitis, unspecified seasonality, unspecified trigger     Discharge Instructions      - We have given you a steroid shot today to help with the wheezing and inflammation in your lungs - Please use albuterol inhaler every 4-6 hours as needed for wheezing or shortness of breath - In addition to the loratadine, please start on a daily nasal spray to help with allergy symptoms - Follow up with Allergy specialist (contact information is provided) if you would like to discuss your symptoms further    ED Prescriptions     Medication Sig Dispense Auth. Provider   fluticasone (FLONASE) 50  MCG/ACT nasal spray Place 2 sprays into both nostrils daily. 16 g Noemi Chapel A, NP   albuterol (VENTOLIN HFA) 108 (90 Base) MCG/ACT inhaler Inhale 1-2 puffs into the lungs every 6 (six) hours as needed for wheezing or shortness of breath. 18 g Eulogio Bear, NP      PDMP not reviewed this encounter.   Eulogio Bear, NP 10/13/21 1227

## 2021-10-13 NOTE — Discharge Instructions (Addendum)
-   We have given you a steroid shot today to help with the wheezing and inflammation in your lungs - Please use albuterol inhaler every 4-6 hours as needed for wheezing or shortness of breath - In addition to the loratadine, please start on a daily nasal spray to help with allergy symptoms - Follow up with Allergy specialist (contact information is provided) if you would like to discuss your symptoms further

## 2021-10-13 NOTE — ED Triage Notes (Signed)
Pt reports he needs a albuterol inhaler refill as he is not able to see his PCP until end of this months and he will be traveling out of the country. Pt reports wheezing x 4 days, worse at night and when he wakes up.

## 2021-11-20 IMAGING — CT CT RENAL STONE PROTOCOL
2 of 4 series · 16 of 46 positions shown, 18 images · non-contrast
Comparison: 08/29/2019

CLINICAL DATA: Left flank pain

EXAM:
CT ABDOMEN AND PELVIS WITHOUT CONTRAST
TECHNIQUE: Multidetector CT imaging of the abdomen and pelvis was performed
following the standard protocol without IV contrast.

[Series 2: axial st · axial · 0.78mm/px · z∈[+898,+1348]mm · 13 of 102 slices shown, 15 images]
[im 6/102  soft-tissue]
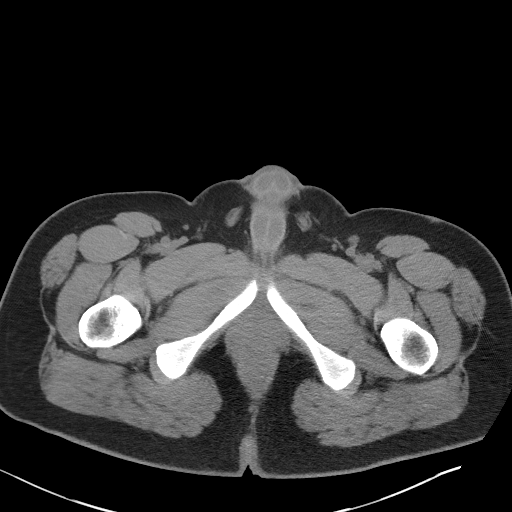
[im 6/102  bone]
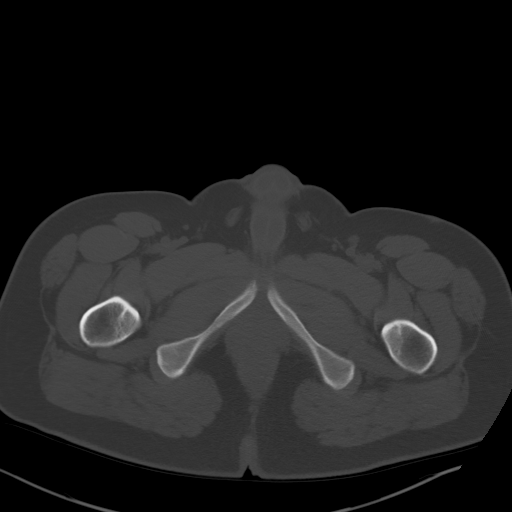
[im 12/102  soft-tissue]
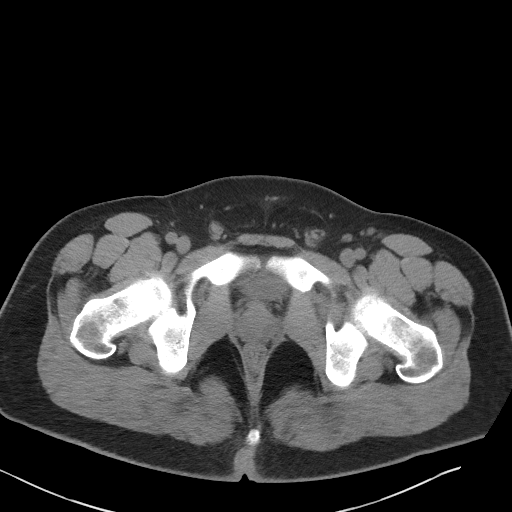
[im 23/102  soft-tissue]
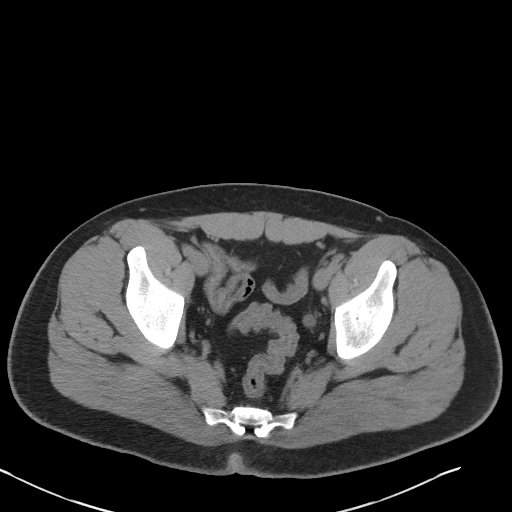
[im 29/102  soft-tissue]
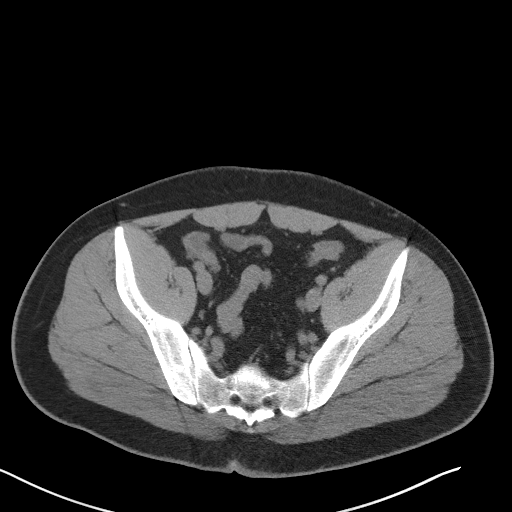
[im 34/102  soft-tissue]
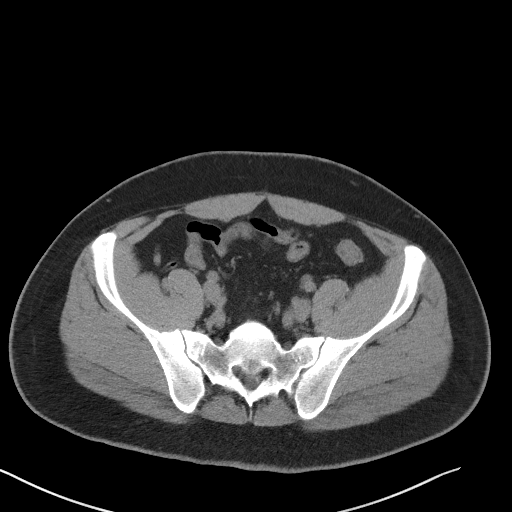
[im 45/102  soft-tissue]
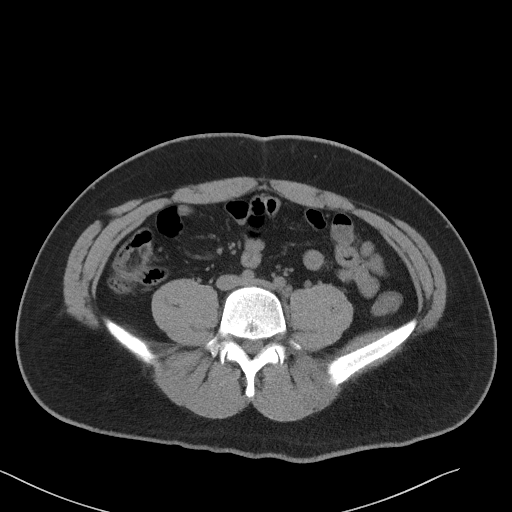
[im 51/102  soft-tissue]
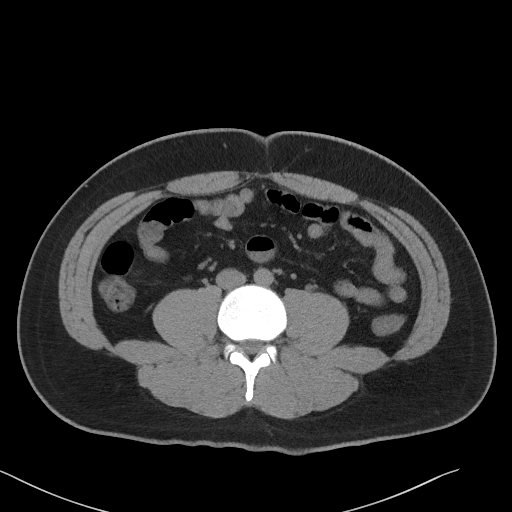
[im 57/102  soft-tissue]
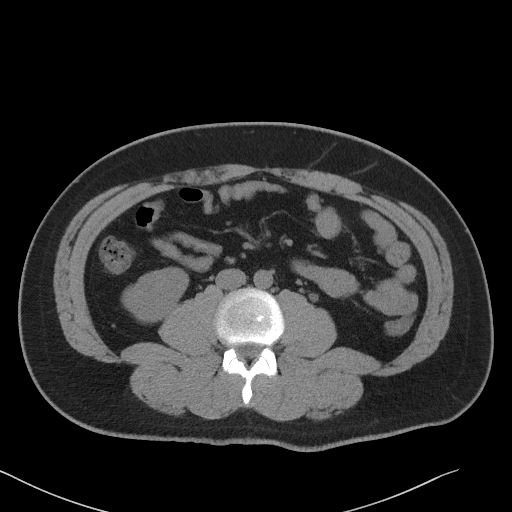
[im 68/102  soft-tissue]
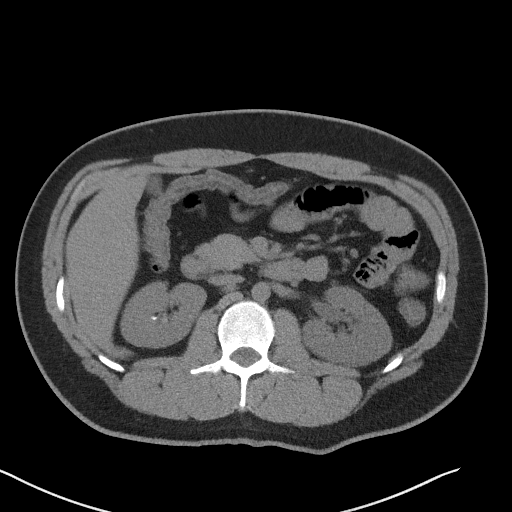
[im 68/102  bone]
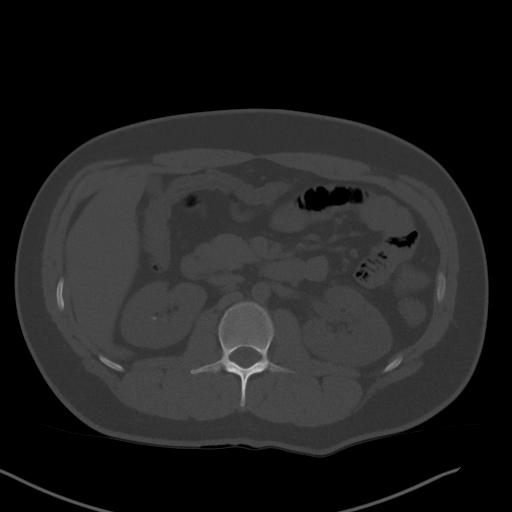
[im 73/102  soft-tissue]
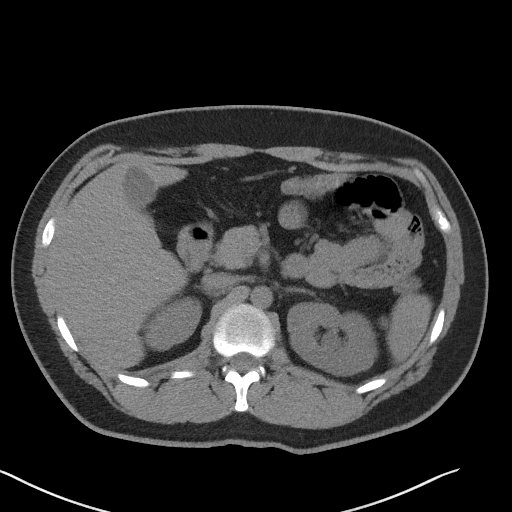
[im 79/102  soft-tissue]
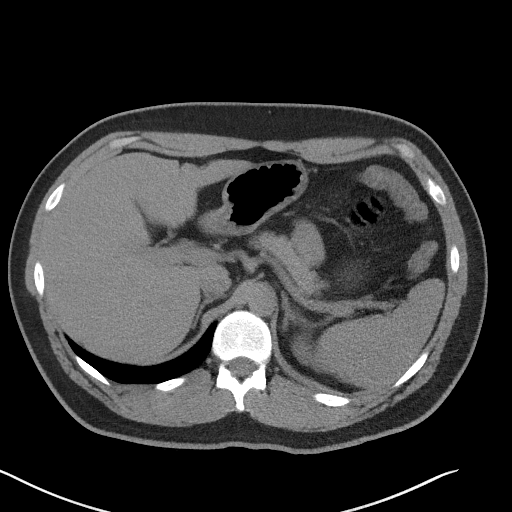
[im 90/102  soft-tissue]
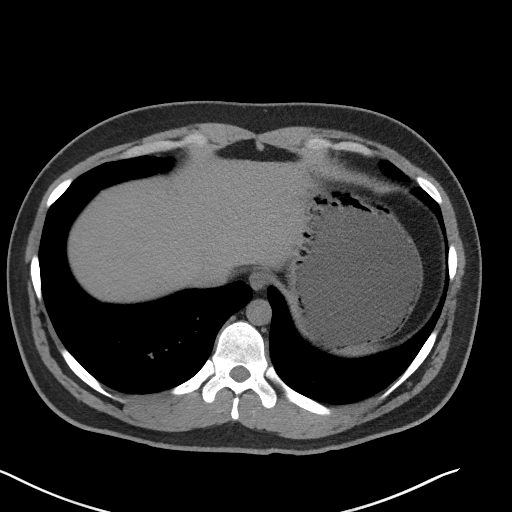
[im 96/102  soft-tissue]
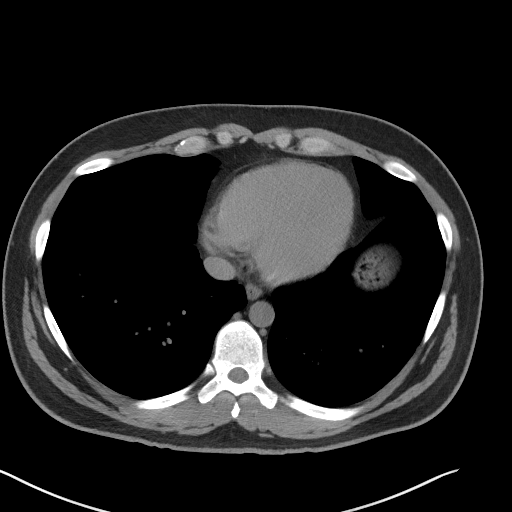

[Series 5: coronal st · coronal · 0.85mm/px · 3 of 89 slices shown]
[im 30/89  soft-tissue]
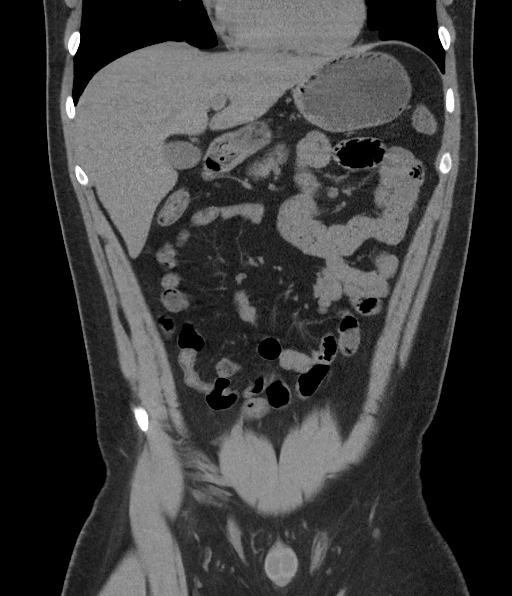
[im 40/89  soft-tissue]
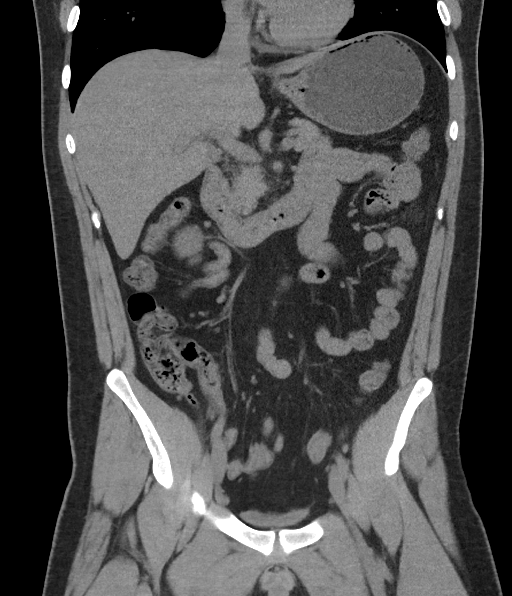
[im 49/89  soft-tissue]
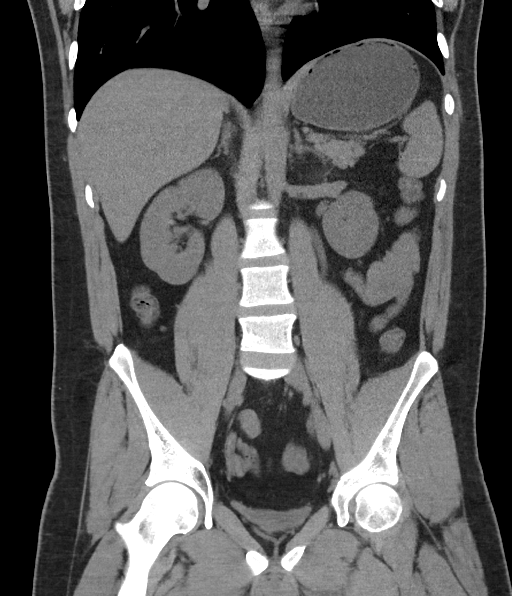

[16 of 46 positions shown; findings below may reference images not displayed]

FINDINGS: Lower chest: Visualized lung bases are clear. The visualized heart
and pericardium are unremarkable.

Hepatobiliary: No focal liver abnormality is seen. No gallstones,
gallbladder wall thickening, or biliary dilatation.

Pancreas: Unremarkable

Spleen: Unremarkable

Adrenals/Urinary Tract: The adrenal glands are unremarkable. The
kidneys are normal in size and position. There is mild left
hydronephrosis and hydroureter to the level of the ureterovesicular
junction where a obstructing 2 mm x 3 mm calculus is seen protruding
into the bladder lumen. Mild bilateral nonobstructing superimposed
nephrolithiasis is noted with multiple calculi measuring up to 3 mm
in diameter. No additional ureteral calculi. No hydronephrosis on
the right. The bladder is decompressed and is otherwise
unremarkable.

Stomach/Bowel: Stomach is within normal limits. Appendix appears
normal. No evidence of bowel wall thickening, distention, or
inflammatory changes. No free intraperitoneal gas or fluid.

Vascular/Lymphatic: No significant vascular findings are present. No
enlarged abdominal or pelvic lymph nodes.

Reproductive: Prostate is unremarkable.

Other: No abdominal wall hernia.  Rectum unremarkable.

Musculoskeletal: No acute bone abnormality. No lytic or blastic bone
lesion identified.
IMPRESSION: Obstructing 2 mm x 3 mm calculus within the left ureterovesicular
junction protruding into the bladder lumen resulting in mild left
hydronephrosis. Superimposed bilateral mild nonobstructing
nephrolithiasis.

## 2022-12-05 IMAGING — CT CT ABD-PELV W/ CM
2 of 4 series · 16 of 46 positions shown, 18 images · IV contrast (agent unspecified)
Comparison: Noncontrast CT Abdomen and Pelvis 06/21/2020. CT
Abdomen and Pelvis with contrast 08/29/2019.

CLINICAL DATA: 25-year-old male with ulcerative colitis, off
medication for 1 year. Blood in stool and abdominal pain.

EXAM:
CT ABDOMEN AND PELVIS WITH CONTRAST
TECHNIQUE: Multidetector CT imaging of the abdomen and pelvis was performed
using the standard protocol following bolus administration of
intravenous contrast.

[Series 3: a/p w/ 5mm · axial · 0.77mm/px · z∈[+919,+1374]mm · 13 of 101 slices shown, 15 images]
[im 5/101  soft-tissue]
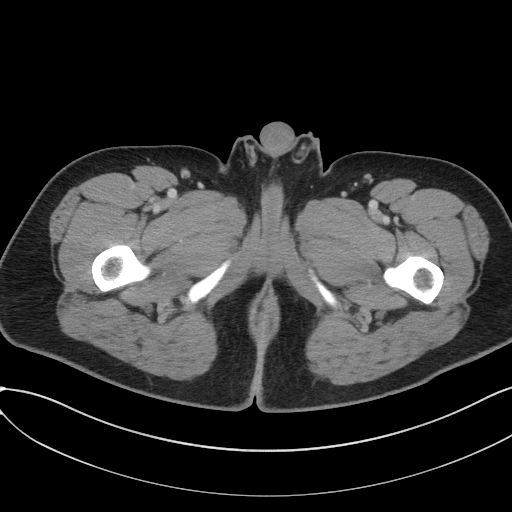
[im 5/101  bone]
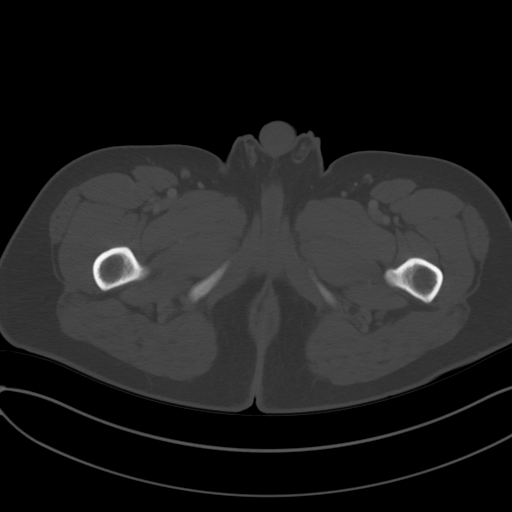
[im 13/101  soft-tissue]
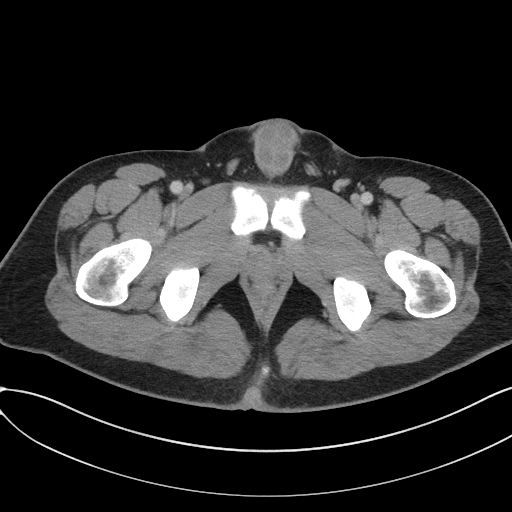
[im 21/101  soft-tissue]
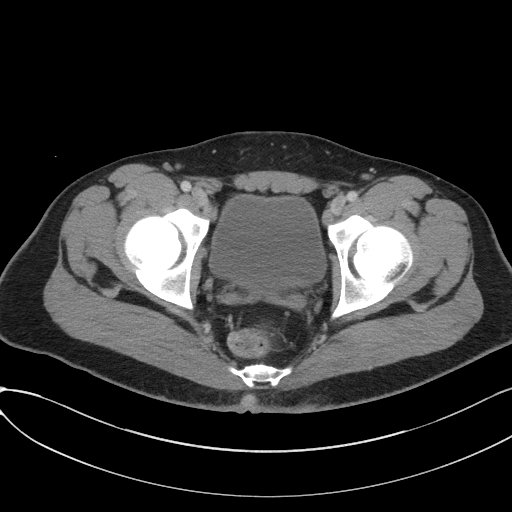
[im 30/101  soft-tissue]
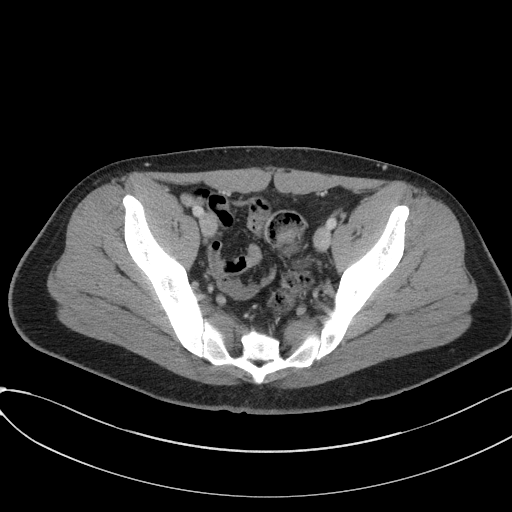
[im 34/101  soft-tissue]
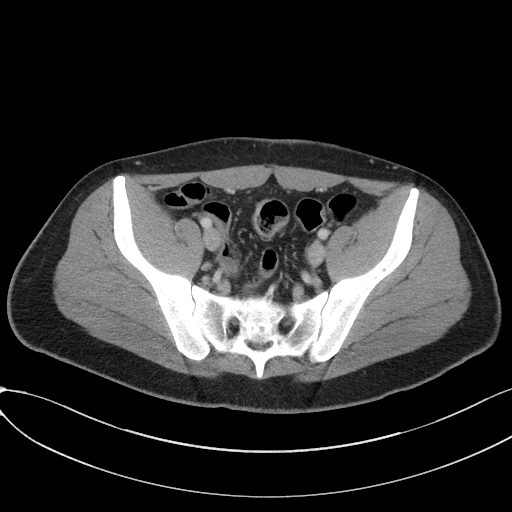
[im 42/101  soft-tissue]
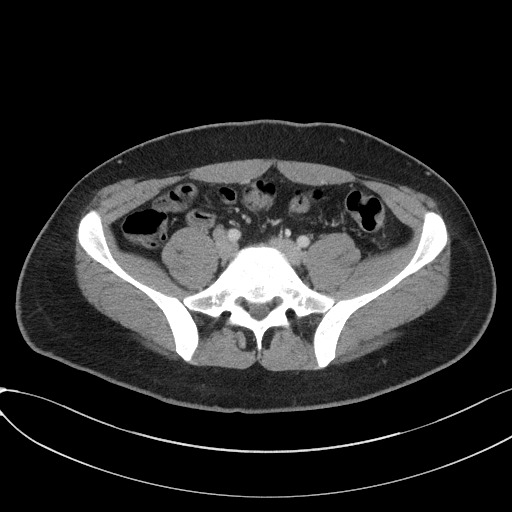
[im 51/101  soft-tissue]
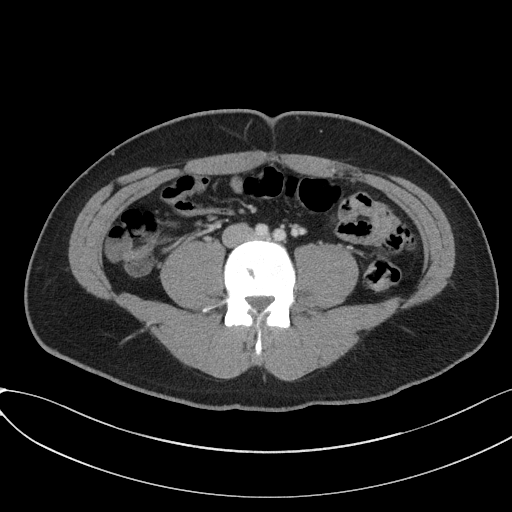
[im 59/101  soft-tissue]
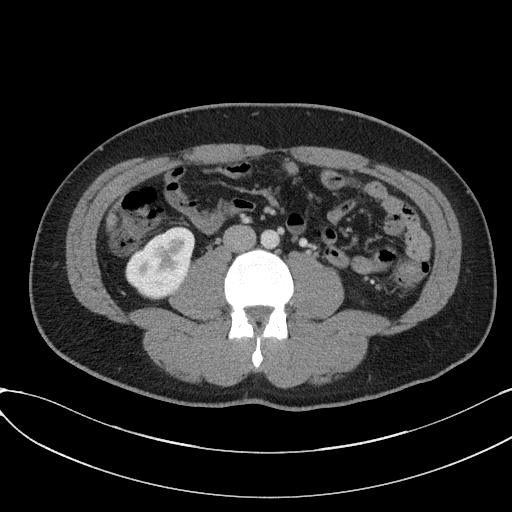
[im 67/101  soft-tissue]
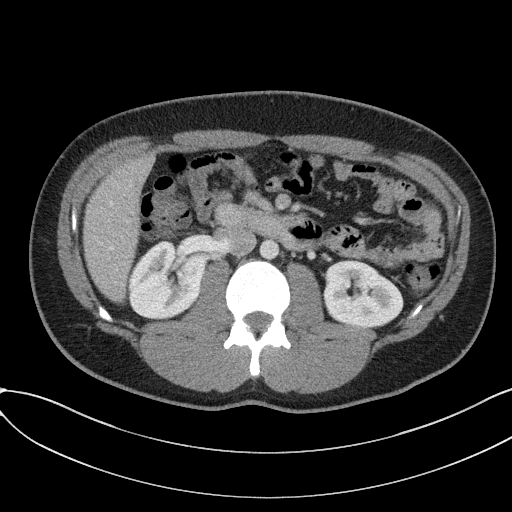
[im 67/101  bone]
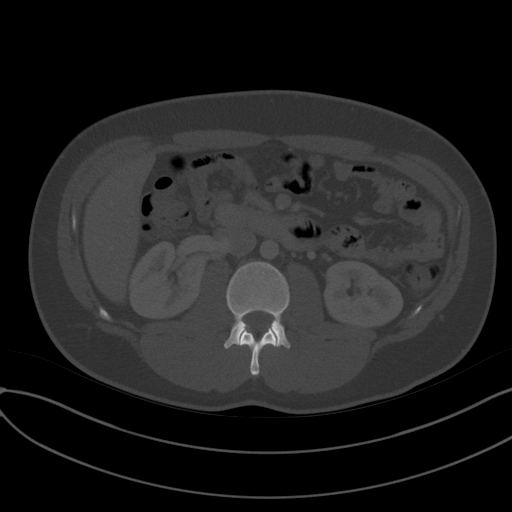
[im 71/101  soft-tissue]
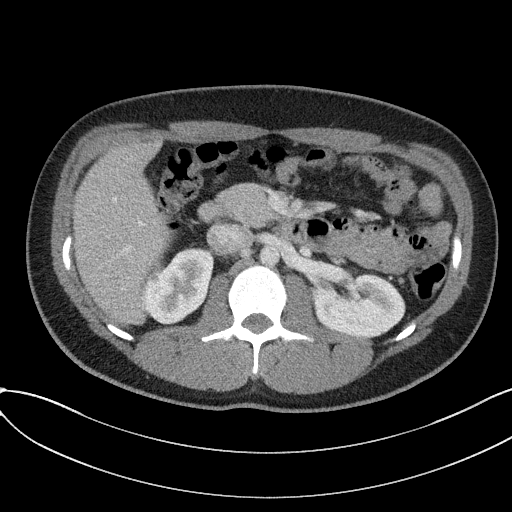
[im 80/101  soft-tissue]
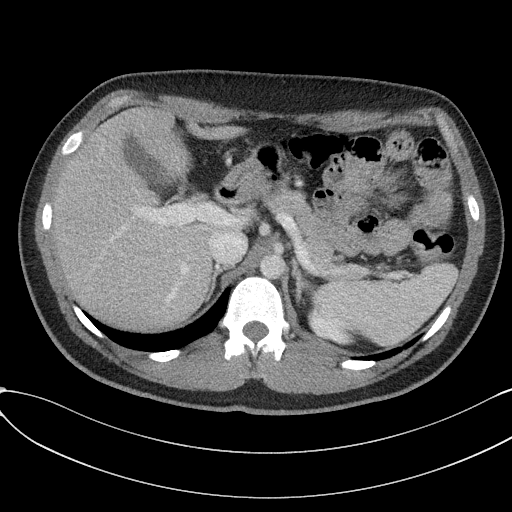
[im 88/101  soft-tissue]
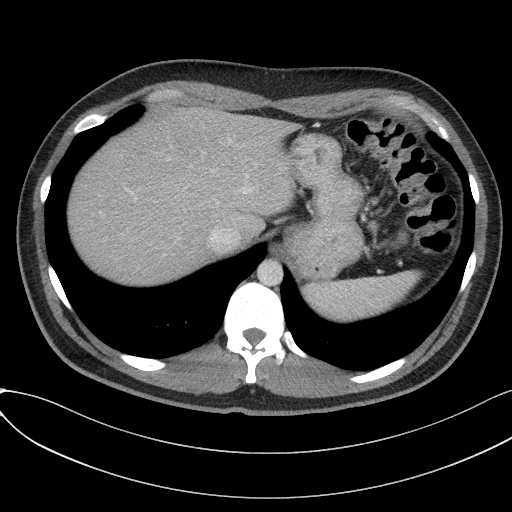
[im 96/101  soft-tissue]
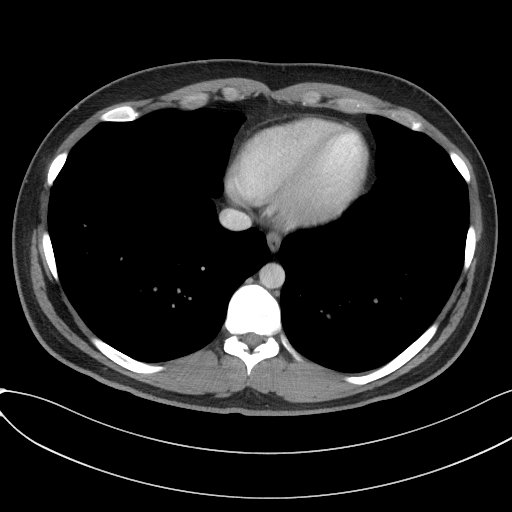

[Series 6: a/p w/ cor · coronal · 0.83mm/px · 3 of 150 slices shown]
[im 50/150  soft-tissue]
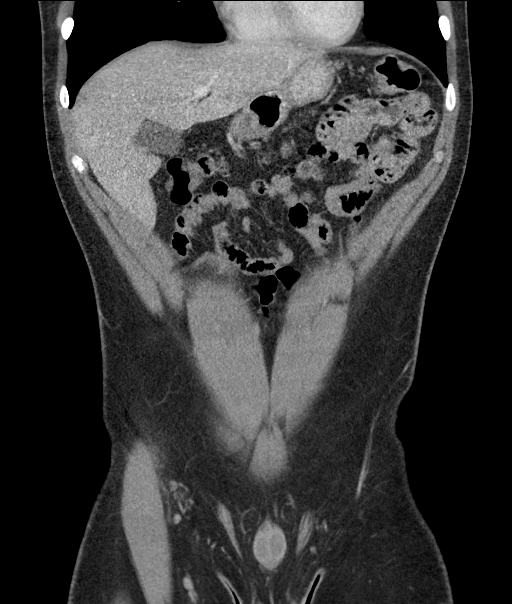
[im 67/150  soft-tissue]
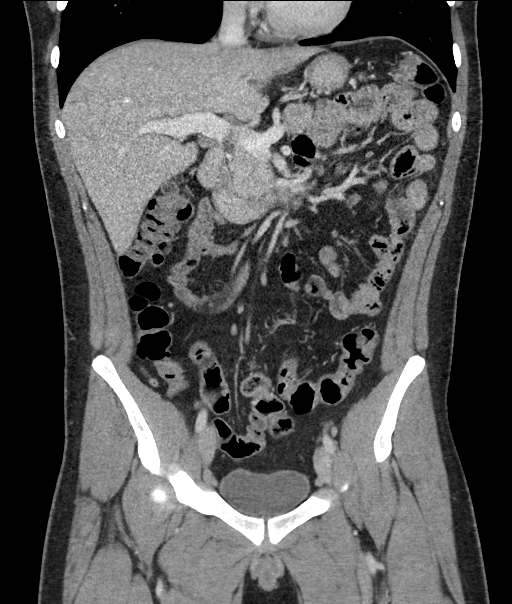
[im 83/150  soft-tissue]
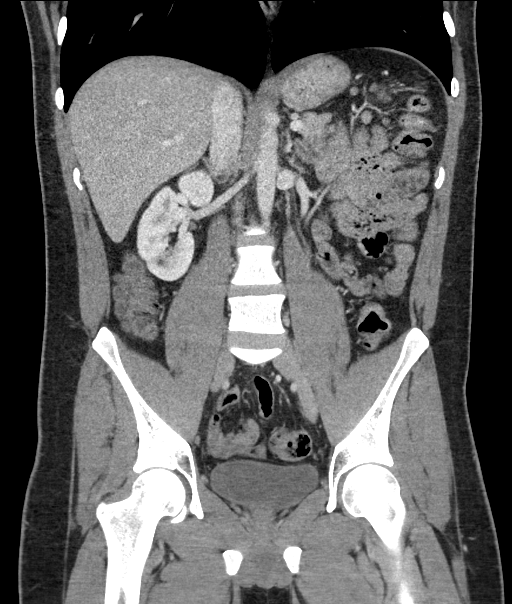

[16 of 46 positions shown; findings below may reference images not displayed]

RADIATION DOSE REDUCTION: This exam was performed according to the
departmental dose-optimization program which includes automated
exposure control, adjustment of the mA and/or kV according to
patient size and/or use of iterative reconstruction technique.

CONTRAST:  100mL OMNIPAQUE IOHEXOL 300 MG/ML  SOLN
FINDINGS: Lower chest: Negative.

Hepatobiliary: Negative liver and gallbladder.

Pancreas: Negative.

Spleen: Negative.

Adrenals/Urinary Tract: Nephrolithiasis demonstrated on the
noncontrast exam last year is less apparent with contrast now (left
upper pole coronal image 104). Nonobstructed kidneys. Occasional
tiny benign cortical cysts which have been present since at least
7178 (no follow-up imaging recommended). Normal adrenal glands.
Decompressed ureters. Unremarkable bladder.

Stomach/Bowel: Distal colon appears largely normal. Mild redundancy
of the sigmoid. No featureless loops in the pelvis. Likewise,
descending and transverse colon are within normal limits. Right
colon and appendix are normal (series 3, image 62).

No dilated small bowel. Terminal ileum and other small bowel loops
are within normal limits. Decompressed stomach. No free air, free
fluid, or mesenteric inflammation identified.

Vascular/Lymphatic: Visible major arterial structures appear patent
and normal. The main portal vein is patent. Early portal venous
system contrast timing otherwise. No lymphadenopathy identified.

Reproductive: Negative.

Other: No pelvic free fluid.

Musculoskeletal: Occasional chronic vertebral body endplate
Schmorl's nodes, including at L5. No acute osseous abnormality
identified.
IMPRESSION: 1. Largely normal CT appearance of the abdomen and pelvis. No active
inflammatory bowel disease identified.
2. Nephrolithiasis, better demonstrated on the noncontrast exam last
year, with no obstructive uropathy.

## 2023-02-02 ENCOUNTER — Ambulatory Visit: Payer: 59

## 2023-02-02 ENCOUNTER — Ambulatory Visit
Admission: RE | Admit: 2023-02-02 | Discharge: 2023-02-02 | Disposition: A | Discharge status: Home / Self Care | Payer: 59 | Source: Ambulatory Visit | Attending: Nurse Practitioner | Admitting: Nurse Practitioner

## 2023-02-02 VITALS — BP 123/80 | HR 88 | Temp 98.0°F | Resp 18

## 2023-02-02 DIAGNOSIS — Z8719 Personal history of other diseases of the digestive system: Secondary | ICD-10-CM

## 2023-02-02 DIAGNOSIS — R109 Unspecified abdominal pain: Secondary | ICD-10-CM

## 2023-02-02 LAB — POCT URINALYSIS DIP (MANUAL ENTRY)
Bilirubin, UA: NEGATIVE
Blood, UA: NEGATIVE
Glucose, UA: NEGATIVE mg/dL
Ketones, POC UA: NEGATIVE mg/dL
Leukocytes, UA: NEGATIVE
Nitrite, UA: NEGATIVE
Protein Ur, POC: NEGATIVE mg/dL
Spec Grav, UA: 1.02 (ref 1.010–1.025)
Urobilinogen, UA: 0.2 U/dL
pH, UA: 7 (ref 5.0–8.0)

## 2023-02-02 MED ORDER — ONDANSETRON 4 MG PO TBDP: 4.0000 mg | ORAL_TABLET | Freq: Three times a day (TID) | ORAL | 0 refills | Status: DC | PRN

## 2023-02-02 MED ORDER — AMOXICILLIN-POT CLAVULANATE 875-125 MG PO TABS: 1.0000 | ORAL_TABLET | Freq: Two times a day (BID) | ORAL | 0 refills | Status: DC

## 2023-02-02 MED ORDER — PREDNISONE 20 MG PO TABS: 40.0000 mg | ORAL_TABLET | Freq: Every day | ORAL | 0 refills | Status: AC

## 2023-02-02 NOTE — Discharge Instructions (Addendum)
CBC and CMP are pending.  Your urinalysis was negative today.  Your x-ray was also negative. Take medication as prescribed. You may take over-the-counter Tylenol as needed for pain, fever, or general discomfort. Recommend a bland diet or low-fat diet while symptoms persist. As discussed, if you experience worsening abdominal pain, fever, chills, nausea, vomiting, diarrhea, or bloody stools, please go to the emergency department immediately. Follow-up with GI as soon as possible for reevaluation. Follow-up as needed.

## 2023-02-02 NOTE — ED Triage Notes (Signed)
Pt reports mid abdominal pain and low back pain x 1 month.   In between PCP's right now

## 2023-02-02 NOTE — ED Provider Notes (Signed)
[facility]    CSN: [id number] Arrival date & time: 02/02/23  1344      History   Chief Complaint Chief Complaint  Patient presents with   Abdominal Pain    History of ulcerative colitis seem to be having a flare up. - Entered by patient    HPI Gregory Clarke is a 27 y.o. male.   The history is provided by the patient.   Patient presents for 1 month history of abdominal pain, and low back pain.  Patient reports prior history of ulcerative colitis.  States that he feels like this may be flaring.  Patient states that he has had intermittent nausea, along with urinary frequency.  He denies fever, chills, chest pain, diarrhea, constipation, or urinary symptoms.  Patient reports that he has seen GI in the past, but has not seen them in quite some time due to his insurance.  Patient states he is currently having normal bowel movements.  Also reports that he does have intermittent feelings of bloating.  States that he was recently dealing with kidney stones.   Past Medical History:  Diagnosis Date   Anemia    Asthma    History of kidney stones    Kidney stones 03/19/2019   Ulcerative colitis [facility])     Patient Active Problem List   Diagnosis Date Noted   Rash and nonspecific skin eruption 01/11/2020   Ulcerative colitis (HCC) 07/23/2019   Bronchial asthma 07/23/2019   Mesenteric lymphadenopathy 03/19/2019   Iron deficiency anemia due to chronic blood loss 03/17/2019   IDA (iron deficiency anemia) 03/16/2019   Bloody diarrhea 03/16/2019   Foot fracture 09/09/2014    Past Surgical History:  Procedure Laterality Date   BIOPSY  03/31/2019   Procedure: BIOPSY;  Surgeon: Malissa Hippo, MD;  [facility]: [facility];  Service: Endoscopy;;   COLONOSCOPY WITH PROPOFOL N/A 03/31/2019   Procedure: COLONOSCOPY WITH PROPOFOL;  Surgeon: Malissa Hippo, MD;  Location: [facility];  Service: Endoscopy;  Laterality: N/A;  7:30   TONSILLECTOMY         Home  Medications    Prior to Admission medications   Medication Sig Start Date End Date Taking? Authorizing Provider  amoxicillin-clavulanate (AUGMENTIN) 875-125 MG tablet Take 1 tablet by mouth every 12 (twelve) hours. 02/02/23  Yes Leath-Warren, Sadie Haber, NP  ondansetron (ZOFRAN-ODT) 4 MG disintegrating tablet Take 1 tablet (4 mg total) by mouth every 8 (eight) hours as needed. 02/02/23  Yes Leath-Warren, Sadie Haber, NP  predniSONE (DELTASONE) 20 MG tablet Take 2 tablets (40 mg total) by mouth daily with breakfast for 5 days. 02/02/23 02/07/23 Yes Leath-Warren, Sadie Haber, NP  albuterol (VENTOLIN HFA) 108 (90 Base) MCG/ACT inhaler Inhale 1-2 puffs into the lungs every 6 (six) hours as needed for wheezing or shortness of breath. 10/13/21   Valentino Nose, NP  chlorhexidine (HIBICLENS) 4 % external liquid Apply topically daily as needed. 08/11/21   Particia Nearing, PA-C  diphenhydrAMINE (BENADRYL) 25 MG tablet Take 25 mg by mouth every 6 (six) hours as needed for allergies.    [provider]  fluticasone (FLONASE) 50 MCG/ACT nasal spray Place 2 sprays into both nostrils daily. 10/13/21   Valentino Nose, NP  hydrOXYzine (ATARAX) 25 MG tablet Take 1 tablet (25 mg total) by mouth every 6 (six) hours. 07/06/21   Gilda Crease, MD  ibuprofen (ADVIL) 200 MG tablet Take 800-1,000 mg by mouth every 6 (six) hours as needed for  headache or moderate pain.    [provider]  Probiotic Product (PROBIOTIC DAILY) CAPS Take 1 capsule by mouth daily.    [provider]  Spacer/Aero-Holding Chambers (AEROCHAMBER PLUS) inhaler Use as instructed 04/07/21   Domenick Gong, MD  triamcinolone cream (KENALOG) 0.1 % Apply 1 application. topically 2 (two) times daily. 08/11/21   Particia Nearing, PA-C    Family History Family History  Problem Relation Age of Onset   Healthy Mother    Healthy Father    Healthy Sister    Healthy Brother     Social History Social  History   Tobacco Use   Smoking status: Never   Smokeless tobacco: Never  Vaping Use   Vaping status: Former  Substance Use Topics   Alcohol use: No   Drug use: Yes    Types: Marijuana    Comment: 03/26/19     Allergies   Patient has no known allergies.   Review of Systems Review of Systems Per HPI  Physical Exam Triage Vital Signs ED Triage Vitals  Encounter Vitals Group     BP 02/02/23 1346 123/80     Systolic BP Percentile --      Diastolic BP Percentile --      Pulse Rate 02/02/23 1346 88     Resp 02/02/23 1346 18     Temp 02/02/23 1346 98 F (36.7 C)     Temp Source 02/02/23 1346 Oral     SpO2 02/02/23 1346 95 %     Weight --      Height --      Head Circumference --      Peak Flow --      Pain Score 02/02/23 1348 6     Pain Loc --      Pain Education --      Exclude from Growth Chart --    No data found.  Updated Vital Signs BP 123/80 (BP Location: Right Arm)   Pulse 88   Temp 98 F (36.7 C) (Oral)   Resp 18   SpO2 95%   Visual Acuity Right Eye Distance:   Left Eye Distance:   Bilateral Distance:    Right Eye Near:   Left Eye Near:    Bilateral Near:     Physical Exam Vitals and nursing note reviewed.  Constitutional:      General: He is not in acute distress.    Appearance: He is well-developed.  HENT:     Head: Normocephalic.  Eyes:     Extraocular Movements: Extraocular movements intact.     Pupils: Pupils are equal, round, and reactive to light.  Cardiovascular:     Rate and Rhythm: Normal rate and regular rhythm.     Pulses: Normal pulses.     Heart sounds: Normal heart sounds.  Pulmonary:     Effort: Pulmonary effort is normal. No respiratory distress.     Breath sounds: Normal breath sounds. No stridor. No wheezing, rhonchi or rales.  Abdominal:     General: Abdomen is flat. Bowel sounds are normal. There is no distension.     Palpations: Abdomen is soft.     Tenderness: There is abdominal tenderness in the right upper  quadrant and right lower quadrant. There is no guarding or rebound.  Musculoskeletal:     Cervical back: Normal range of motion.  Skin:    General: Skin is warm and dry.  Neurological:     General: No focal deficit present.  Mental Status: He is alert and oriented to person, place, and time.  Psychiatric:        Mood and Affect: Mood normal.        Behavior: Behavior normal.      UC Treatments / Results  Labs (all labs ordered are listed, but only abnormal results are displayed) Labs Reviewed  COMPREHENSIVE METABOLIC PANEL  CBC WITH DIFFERENTIAL/PLATELET  POCT URINALYSIS DIP (MANUAL ENTRY)    EKG   Radiology DG Abd 2 Views  Result Date: 02/02/2023 CLINICAL DATA:  abdominal pain x 1 month, hx of ulcerative colitis EXAM: ABDOMEN - 2 VIEW COMPARISON:  None Available. FINDINGS: The bowel gas pattern is normal. There is no evidence of free air. No radio-opaque calculi or other significant radiographic abnormality is seen. IMPRESSION: Negative. Electronically Signed   By: Tish Frederickson M.D.   On: 02/02/2023 14:25    Procedures Procedures (including critical care time)  Medications Ordered in UC Medications - No data to display  Initial Impression / Assessment and Plan / UC Course  I have reviewed the triage vital signs and the nursing notes.  Pertinent labs & imaging results that were available during my care of the patient were reviewed by me and considered in my medical decision making (see chart for details).  Urinalysis was negative, along with abdominal x-ray.  CBC and CMP are pending.  Will treat patient empirically for possible ulcerative colitis flare with prednisone 40 mg, and Augmentin 875/2025 mg tablets.  Supportive care recommendations were provided and discussed with the patient to include over-the-counter analgesics, a bland diet, and to monitor for worsening symptoms.  Patient was given strict ER follow-up precautions along with recommendations to follow-up  with GI.  Patient was in agreement with this plan of care and verbalized understanding.  All questions were answered.  Patient stable for discharge.  Final Clinical Impressions(s) / UC Diagnoses   Final diagnoses:  Abdominal pain, unspecified abdominal location  History of ulcerative colitis     Discharge Instructions      CBC and CMP are pending.  Your urinalysis was negative today.  Your x-ray was also negative. Take medication as prescribed. You may take over-the-counter Tylenol as needed for pain, fever, or general discomfort. Recommend a bland diet or low-fat diet while symptoms persist. As discussed, if you experience worsening abdominal pain, fever, chills, nausea, vomiting, diarrhea, or bloody stools, please go to the emergency department immediately. Follow-up with GI as soon as possible for reevaluation. Follow-up as needed.      ED Prescriptions     Medication Sig Dispense Auth. Provider   amoxicillin-clavulanate (AUGMENTIN) 875-125 MG tablet Take 1 tablet by mouth every 12 (twelve) hours. 14 tablet Leath-Warren, Sadie Haber, NP   predniSONE (DELTASONE) 20 MG tablet Take 2 tablets (40 mg total) by mouth daily with breakfast for 5 days. 10 tablet Leath-Warren, Sadie Haber, NP   ondansetron (ZOFRAN-ODT) 4 MG disintegrating tablet Take 1 tablet (4 mg total) by mouth every 8 (eight) hours as needed. 20 tablet Leath-Warren, Sadie Haber, NP      PDMP not reviewed this encounter.   Abran Cantor, NP 02/02/23 1601

## 2023-02-03 LAB — CBC WITH DIFFERENTIAL/PLATELET
Basophils Absolute: 0.1 10*3/uL (ref 0.0–0.2)
Basos: 1 %
EOS (ABSOLUTE): 0.6 10*3/uL — ABNORMAL HIGH (ref 0.0–0.4)
Eos: 9 %
Hematocrit: 43.4 % (ref 37.5–51.0)
Hemoglobin: 15.1 g/dL (ref 13.0–17.7)
Immature Grans (Abs): 0 10*3/uL (ref 0.0–0.1)
Immature Granulocytes: 0 %
Lymphocytes Absolute: 2 10*3/uL (ref 0.7–3.1)
Lymphs: 29 %
MCH: 32.3 pg (ref 26.6–33.0)
MCHC: 34.8 g/dL (ref 31.5–35.7)
MCV: 93 fL (ref 79–97)
Monocytes Absolute: 0.6 10*3/uL (ref 0.1–0.9)
Monocytes: 9 %
Neutrophils Absolute: 3.6 10*3/uL (ref 1.4–7.0)
Neutrophils: 52 %
Platelets: 333 10*3/uL (ref 150–450)
RBC: 4.68 x10E6/uL (ref 4.14–5.80)
RDW: 13.1 % (ref 11.6–15.4)
WBC: 6.8 10*3/uL (ref 3.4–10.8)

## 2023-02-03 LAB — COMPREHENSIVE METABOLIC PANEL
ALT: 23 [IU]/L (ref 0–44)
AST: 16 [IU]/L (ref 0–40)
Albumin: 4.7 g/dL (ref 4.3–5.2)
Alkaline Phosphatase: 86 [IU]/L (ref 44–121)
BUN/Creatinine Ratio: 14 (ref 9–20)
BUN: 11 mg/dL (ref 6–20)
Bilirubin Total: 0.2 mg/dL (ref 0.0–1.2)
CO2: 20 mmol/L (ref 20–29)
Calcium: 9.8 mg/dL (ref 8.7–10.2)
Chloride: 103 mmol/L (ref 96–106)
Creatinine, Ser: 0.77 mg/dL (ref 0.76–1.27)
Globulin, Total: 2.4 g/dL (ref 1.5–4.5)
Glucose: 88 mg/dL (ref 70–99)
Potassium: 4.4 mmol/L (ref 3.5–5.2)
Sodium: 138 mmol/L (ref 134–144)
Total Protein: 7.1 g/dL (ref 6.0–8.5)
eGFR: 127 mL/min/{1.73_m2} (ref >59)

## 2023-02-24 ENCOUNTER — Telehealth: Payer: 59 | Admitting: Family

## 2023-02-24 DIAGNOSIS — J4521 Mild intermittent asthma with (acute) exacerbation: Secondary | ICD-10-CM

## 2023-02-24 MED ORDER — ALBUTEROL SULFATE HFA 108 (90 BASE) MCG/ACT IN AERS: 1.0000 | INHALATION_SPRAY | Freq: Four times a day (QID) | RESPIRATORY_TRACT | 2 refills | Status: DC | PRN

## 2023-02-24 MED ORDER — PREDNISONE 20 MG PO TABS: 40.0000 mg | ORAL_TABLET | Freq: Every day | ORAL | 0 refills | Status: AC

## 2023-02-24 NOTE — Progress Notes (Signed)

## 2023-05-12 ENCOUNTER — Telehealth: Admitting: Family Medicine

## 2023-05-12 DIAGNOSIS — J4521 Mild intermittent asthma with (acute) exacerbation: Secondary | ICD-10-CM | POA: Diagnosis not present

## 2023-05-12 MED ORDER — PREDNISONE 20 MG PO TABS: 20.0000 mg | ORAL_TABLET | Freq: Two times a day (BID) | ORAL | 0 refills | Status: AC

## 2023-05-12 MED ORDER — ALBUTEROL SULFATE HFA 108 (90 BASE) MCG/ACT IN AERS: 2.0000 | INHALATION_SPRAY | Freq: Four times a day (QID) | RESPIRATORY_TRACT | 0 refills | Status: DC | PRN

## 2023-05-12 NOTE — Progress Notes (Signed)

## 2023-05-17 ENCOUNTER — Encounter (HOSPITAL_COMMUNITY): Payer: Self-pay | Admitting: Hematology

## 2023-05-23 ENCOUNTER — Telehealth: Admitting: Physician Assistant

## 2023-05-23 DIAGNOSIS — L309 Dermatitis, unspecified: Secondary | ICD-10-CM

## 2023-05-23 MED ORDER — TRIAMCINOLONE ACETONIDE 0.1 % EX OINT
1.0000 | TOPICAL_OINTMENT | Freq: Two times a day (BID) | CUTANEOUS | 0 refills | Status: DC
Start: 2023-05-23 — End: 2023-06-13

## 2023-05-23 MED ORDER — PREDNISONE 20 MG PO TABS
40.0000 mg | ORAL_TABLET | Freq: Every day | ORAL | 0 refills | Status: DC
Start: 2023-05-23 — End: 2023-06-13

## 2023-05-23 MED ORDER — HYDROXYZINE HCL 25 MG PO TABS: 25.0000 mg | ORAL_TABLET | Freq: Four times a day (QID) | ORAL | 0 refills | Status: DC

## 2023-05-23 NOTE — Progress Notes (Signed)
 E-Visit for Eczema  We are sorry that you are not feeling well. Here is how we plan to help! Based on what you shared with me it looks like you have eczema (atopic dermatitis).  Although the cause of eczema is not completely understood, genetics appear to play a strong role, and people with a family history of eczema are at increased risk of developing the condition. In most people with eczema, there is a genetic abnormality in the outermost layer of the skin, called the epidermis   Most people with eczema develop their first symptoms as children, before the age of 70. Intense itching of the skin, patches of redness, small bumps, and skin flaking are common. Scratching can further inflame the skin and worsen the itching. The itchiness may be more noticeable at nighttime.  Eczema commonly affects the back of the neck, the elbow creases, and the backs of the knees. Other affected areas may include the face, wrists, and forearms. The skin may become thickened and darkened, or even scarred, from repeated scratching. Eliminating factors that aggravate your eczema symptoms can help to control the symptoms. Possible triggers may include: ? Cold or dry environments ? Sweating ? Emotional stress or anxiety ? Rapid temperature changes ? Exposure to certain chemicals or cleaning solutions, including soaps and detergents, perfumes and cosmetics, wool or synthetic fibers, dust, sand, and cigarette smoke Keeping your skin hydrated Emollients -- Emollients are creams and ointments that moisturize the skin and prevent it from drying out. The best emollients for people with eczema are thick creams (such as Eucerin, Cetaphil, and Nutraderm) or ointments (such as petroleum jelly, Aquaphor, and Vaseline), which contain little to no water. Emollients are most effective when applied immediately after bathing. Emollients can be applied twice a day or more often if needed. Lotions contain more water than creams and  ointments and are less effective for moisturizing the skin. Bathing -- It is not clear if showers or baths are better for keeping the skin hydrated. Lukewarm baths or showers can hydrate and cool the skin, temporarily relieving itching from eczema. An unscented, mild soap or non-soap cleanser (such as Cetaphil) should be used sparingly. Apply an emollient immediately after bathing or showering to prevent your skin from drying out as a result of water evaporation. Emollient bath additives (products you add to the bath water) have not been found to help relieve symptoms. Hot or long baths (more than 10 to 15 minutes) and showers should be avoided since they can dry out the skin.  Based on what you shared with me you may have eczema.   I have prescribed: Triamcinolone ointment. Apply to the effected areas twice per day for up to two weeks, sparing use on your race/groin. I have also prescribed a 5-day course of Prednisone to take as directed. A refill of your hydroxyzine has been sent in. We are unfortunately unable to place referrals. Do you have a primary care provider or see another specialist? If so, they should be able to place a referral for you.    I recommend dilute bleach baths for people with eczema. These baths help to decrease the number of bacteria on the skin that can cause infections or worsen symptoms. To prepare a bleach bath, one-fourth to one-half cup of bleach is placed in a full bathtub (about 40 gallons) of water. Bleach baths are usually taken for 5 to 10 minutes twice per week and should be followed by application of an emollient (listed above). I  recommend you take Benadryl 25mg  - 50mg  every 4 hours to control the symptoms (including itching) but if they last over 24 hours it is best that you see an office based provider for follow up.  HOME CARE: Take lukewarm showers or baths Apply creams and ointments to prevent the skin from drying (Eucerin, Cetaphil, Nutraderm, petroleum  jelly, Aquaphor or Vaseline) - these products contain less water than other lotions and are more effective for moisturizing the skin Limit exposure to cold or dry environments, sweating, emotional stress and anxiety, rapid temperature changes and exposure to chemicals and cleaning products, soaps and detergents, perfumes, cosmetics, wool and synthetic fibers, dust, sand and cigarette- factors which can aggravate eczema symptoms.  Use a hydrocortisone cream once or twice a day Take an antihistamine like Benadryl for widespread rashes that itch.  The adult dosage of Benadryl is 25-50 mg by mouth 4 times daily. Caution: This type of medication may cause sleepiness.  Do not drink alcohol, drive, or operate dangerous machinery while taking antihistamines.  Do not take these medications if you have prostate enlargement.  Read the package instructions thoroughly on all medications that you take.  GET HELP RIGHT AWAY IF: Symptoms that don't go away after treatment. Severe itching that persists. You develop a fever. Your skin begins to drain. You have a sore throat. You become short of breath.  MAKE SURE YOU   Understand these instructions. Will watch your condition. Will get help right away if you are not doing well or get worse.    Thank you for choosing an e-visit.  Your e-visit answers were reviewed by a board certified advanced clinical practitioner to complete your personal care plan. Depending upon the condition, your plan could have included both over the counter or prescription medications.  Please review your pharmacy choice. Make sure the pharmacy is open so you can pick up prescription now. If there is a problem, you may contact your provider through [employer] and have the prescription routed to another pharmacy.  Your safety is important to [place]. If you have drug allergies check your prescription carefully.   For the next 24 hours you can use MyChart to ask questions about  today's visit, request a non-urgent call back, or ask for a work or school excuse. You will get an email in the next two days asking about your experience. I hope that your e-visit has been valuable and will speed your recovery.

## 2023-05-23 NOTE — Progress Notes (Signed)
 I have spent 5 minutes in review of e-visit questionnaire, review and updating patient chart, medical decision making and response to patient.   Piedad Climes, PA-C

## 2023-06-13 ENCOUNTER — Telehealth: Admitting: Physician Assistant

## 2023-06-13 ENCOUNTER — Ambulatory Visit
Admission: RE | Admit: 2023-06-13 | Discharge: 2023-06-13 | Disposition: A | Discharge status: Home / Self Care | Source: Ambulatory Visit | Attending: Nurse Practitioner | Admitting: Nurse Practitioner

## 2023-06-13 VITALS — BP 127/78 | HR 83 | Temp 97.9°F | Resp 18

## 2023-06-13 DIAGNOSIS — R21 Rash and other nonspecific skin eruption: Secondary | ICD-10-CM | POA: Diagnosis not present

## 2023-06-13 DIAGNOSIS — L2989 Other pruritus: Secondary | ICD-10-CM

## 2023-06-13 MED ORDER — PREDNISONE 10 MG (21) PO TBPK: ORAL_TABLET | Freq: Every day | ORAL | 0 refills | Status: DC

## 2023-06-13 MED ORDER — TRIAMCINOLONE ACETONIDE 0.5 % EX OINT: 1.0000 | TOPICAL_OINTMENT | Freq: Two times a day (BID) | CUTANEOUS | 0 refills | Status: DC

## 2023-06-13 MED ORDER — HYDROXYZINE HCL 25 MG PO TABS: 25.0000 mg | ORAL_TABLET | Freq: Three times a day (TID) | ORAL | 0 refills | Status: DC | PRN

## 2023-06-13 NOTE — Progress Notes (Signed)
  Because you were just on a course of prednisone in the past couple of weeks, and with recurring rash, I feel your condition warrants further evaluation and I recommend that you be seen in a face-to-face visit.   NOTE: There will be NO CHARGE for this E-Visit   If you are having a true medical emergency, please call 911.     For an urgent face to face visit, [facility] has multiple urgent care centers for your convenience.  Click the link below for the full list of locations and hours, walk-in wait times, appointment scheduling options and driving directions:  Urgent Care - [place], [place], [place], [place], [place], [place]  [place]     Your MyChart E-visit questionnaire answers were reviewed by a board certified advanced clinical practitioner to complete your personal care plan based on your specific symptoms.    Thank you for using e-Visits.

## 2023-06-13 NOTE — ED Provider Notes (Signed)
 [facility]    CSN: [id number] Arrival date & time: 06/13/23  1343      History   Chief Complaint Chief Complaint  Patient presents with   Rash    Excema like rashes all over the body. - Entered by patient    HPI Gregory Clarke is a 28 y.o. male.   Patient presents today with 2-week history of itchy, raised, red rash to bilateral arms, neck, back, chest, groin, legs.  Reports history of asthma and allergies, thinks he also has eczema but has never been formally diagnosed.  Reports the rash is typically worst in his folds including neck, elbows, and behind the knees but it seems to be sparing those areas currently.  No recent gardening or outdoor landscaping.  No recent change in detergents, soaps, personal care products.  He was treated for same via e-visit a couple of weeks ago with prednisone 40 mg daily for 5 days and reports the rash cleared up temporarily.  He has been taking Benadryl and other over-the-counter allergy medicines without much improvement.  Denies recent fever, cough, congestion, sore throat.  No oozing from the rash.  Reports he typically gets prednisone, hydroxyzine, and a topical steroid ointment which seems to help pretty well with the symptoms.    Past Medical History:  Diagnosis Date   Anemia    Asthma    History of kidney stones    Kidney stones 03/19/2019   Ulcerative colitis [facility])     Patient Active Problem List   Diagnosis Date Noted   Rash and nonspecific skin eruption 01/11/2020   Ulcerative colitis (HCC) 07/23/2019   Bronchial asthma 07/23/2019   Mesenteric lymphadenopathy 03/19/2019   Iron deficiency anemia due to chronic blood loss 03/17/2019   IDA (iron deficiency anemia) 03/16/2019   Bloody diarrhea 03/16/2019   Foot fracture 09/09/2014    Past Surgical History:  Procedure Laterality Date   BIOPSY  03/31/2019   Procedure: BIOPSY;  Surgeon: Malissa Hippo, MD;  [facility];  Service: Endoscopy;;    COLONOSCOPY WITH PROPOFOL N/A 03/31/2019   Procedure: COLONOSCOPY WITH PROPOFOL;  Surgeon: Malissa Hippo, MD;  [facility];  Service: Endoscopy;  Laterality: N/A;  7:30   TONSILLECTOMY         Home Medications    Prior to Admission medications   Medication Sig Start Date End Date Taking? Authorizing Provider  hydrOXYzine (ATARAX) 25 MG tablet Take 1 tablet (25 mg total) by mouth every 8 (eight) hours as needed. 06/13/23  Yes Valentino Nose, NP  predniSONE (STERAPRED UNI-PAK 21 TAB) 10 MG (21) TBPK tablet Take by mouth daily. Take 6 tabs by mouth daily  for 2 days, then 5 tabs for 2 days, then 4 tabs for 2 days, then 3 tabs for 2 days, 2 tabs for 2 days, then 1 tab by mouth daily for 2 days 06/13/23  Yes Cathlean Marseilles A, NP  triamcinolone ointment (KENALOG) 0.5 % Apply 1 Application topically 2 (two) times daily. Apply sparingly twice daily as needed.  Do not use for more than 2 weeks in a row. 06/13/23  Yes Valentino Nose, NP  albuterol (VENTOLIN HFA) 108 (90 Base) MCG/ACT inhaler Inhale 1-2 puffs into the lungs every 6 (six) hours as needed for wheezing or shortness of breath. 02/24/23   Junie Spencer, FNP  albuterol (VENTOLIN HFA) 108 (90 Base) MCG/ACT inhaler Inhale 2 puffs into the lungs every 6 (six) hours as needed for  wheezing or shortness of breath. 05/12/23   Delorse Lek, FNP  ibuprofen (ADVIL) 200 MG tablet Take 800-1,000 mg by mouth every 6 (six) hours as needed for headache or moderate pain.    [provider]  Probiotic Product (PROBIOTIC DAILY) CAPS Take 1 capsule by mouth daily.    [provider]  Spacer/Aero-Holding Chambers (AEROCHAMBER PLUS) inhaler Use as instructed 04/07/21   Domenick Gong, MD    Family History Family History  Problem Relation Age of Onset   Healthy Mother    Healthy Father    Healthy Sister    Healthy Brother     Social History Social History   Tobacco Use   Smoking status: Never   Smokeless  tobacco: Never  Vaping Use   Vaping status: Former  Substance Use Topics   Alcohol use: No   Drug use: Yes    Types: Marijuana    Comment: 03/26/19     Allergies   Patient has no known allergies.   Review of Systems Review of Systems Per HPI  Physical Exam Triage Vital Signs ED Triage Vitals [06/13/23 1347]  Encounter Vitals Group     BP 127/78     Systolic BP Percentile      Diastolic BP Percentile      Pulse Rate 83     Resp 18     Temp 97.9 F (36.6 C)     Temp Source Oral     SpO2 97 %     Weight      Height      Head Circumference      Peak Flow      Pain Score 4     Pain Loc      Pain Education      Exclude from Growth Chart    No data found.  Updated Vital Signs BP 127/78 (BP Location: Right Arm)   Pulse 83   Temp 97.9 F (36.6 C) (Oral)   Resp 18   SpO2 97%   Visual Acuity Right Eye Distance:   Left Eye Distance:   Bilateral Distance:    Right Eye Near:   Left Eye Near:    Bilateral Near:     Physical Exam Vitals and nursing note reviewed.  Constitutional:      General: He is not in acute distress.    Appearance: Normal appearance. He is not toxic-appearing.  HENT:     Head: Normocephalic and atraumatic.     Mouth/Throat:     Mouth: Mucous membranes are moist.     Pharynx: Oropharynx is clear.     Comments: Posterior oropharynx is patent Pulmonary:     Effort: Pulmonary effort is normal. No respiratory distress.  Skin:    General: Skin is warm and dry.     Capillary Refill: Capillary refill takes less than 2 seconds.     Coloration: Skin is not jaundiced or pale.     Findings: Erythema and rash present. Rash is macular, papular and urticarial.     Comments: Widespread, erythematous, raised, urticarial appearing rash noted to face, chest, back, arms, legs  Neurological:     Mental Status: He is alert and oriented to person, place, and time.  Psychiatric:        Behavior: Behavior is cooperative.      UC Treatments / Results   Labs (all labs ordered are listed, but only abnormal results are displayed) Labs Reviewed - No data to display  EKG  Radiology No results found.  Procedures Procedures (including critical care time)  Medications Ordered in UC Medications - No data to display  Initial Impression / Assessment and Plan / UC Course  I have reviewed the triage vital signs and the nursing notes.  Pertinent labs & imaging results that were available during my care of the patient were reviewed by me and considered in my medical decision making (see chart for details).   Patient is well-appearing, normotensive, afebrile, not tachycardic, not tachypneic, oxygenating well on room air.    1. Rash and nonspecific skin eruption Highly suspicious for eczema  Treat with oral prednisone taper, oral hydroxyzine, and topical steroid ointment We discussed oral antihistamine regimen as well as following up with asthma/allergy clinic for further evaluation and management of chronic symptoms and contact information was provided today  The patient was given the opportunity to ask questions.  All questions answered to their satisfaction.  The patient is in agreement to this plan.   Final Clinical Impressions(s) / UC Diagnoses   Final diagnoses:  Rash and nonspecific skin eruption     Discharge Instructions      Start taking the oral prednisone taper pack, hydroxyzine every 8 hours as needed, and using the Kenalog ointment twice daily on your skin.  Continue the fragrance free lotion that you are using twice daily.  Recommend follow-up with primary care provider and make an appointment with allergy and asthma clinic; contact information has been attached.    ED Prescriptions     Medication Sig Dispense Auth. Provider   predniSONE (STERAPRED UNI-PAK 21 TAB) 10 MG (21) TBPK tablet Take by mouth daily. Take 6 tabs by mouth daily  for 2 days, then 5 tabs for 2 days, then 4 tabs for 2 days, then 3 tabs for 2 days,  2 tabs for 2 days, then 1 tab by mouth daily for 2 days 42 tablet Cathlean Marseilles A, NP   triamcinolone ointment (KENALOG) 0.5 % Apply 1 Application topically 2 (two) times daily. Apply sparingly twice daily as needed.  Do not use for more than 2 weeks in a row. 30 g Cathlean Marseilles A, NP   hydrOXYzine (ATARAX) 25 MG tablet Take 1 tablet (25 mg total) by mouth every 8 (eight) hours as needed. 30 tablet Valentino Nose, NP      PDMP not reviewed this encounter.   Valentino Nose, NP 06/13/23 1416

## 2023-06-13 NOTE — ED Triage Notes (Signed)
 Rash on arms, neck , shoulder, back and groin area x 2 weeks.  States has recently been on prednisone and it cleared up the rash.

## 2023-06-13 NOTE — Discharge Instructions (Signed)
 Start taking the oral prednisone taper pack, hydroxyzine every 8 hours as needed, and using the Kenalog ointment twice daily on your skin.  Continue the fragrance free lotion that you are using twice daily.  Recommend follow-up with primary care provider and make an appointment with allergy and asthma clinic; contact information has been attached.

## 2023-06-24 ENCOUNTER — Ambulatory Visit: Admitting: Internal Medicine

## 2023-07-18 ENCOUNTER — Ambulatory Visit: Admitting: Internal Medicine

## 2023-07-18 ENCOUNTER — Encounter: Payer: Self-pay | Admitting: Internal Medicine

## 2023-07-18 VITALS — BP 128/86 | HR 77 | Temp 98.4°F | Ht 74.0 in | Wt 224.6 lb

## 2023-07-18 DIAGNOSIS — J4521 Mild intermittent asthma with (acute) exacerbation: Secondary | ICD-10-CM

## 2023-07-18 DIAGNOSIS — F411 Generalized anxiety disorder: Secondary | ICD-10-CM

## 2023-07-18 DIAGNOSIS — K51011 Ulcerative (chronic) pancolitis with rectal bleeding: Secondary | ICD-10-CM

## 2023-07-18 DIAGNOSIS — N200 Calculus of kidney: Secondary | ICD-10-CM

## 2023-07-18 DIAGNOSIS — F339 Major depressive disorder, recurrent, unspecified: Secondary | ICD-10-CM

## 2023-07-18 DIAGNOSIS — D500 Iron deficiency anemia secondary to blood loss (chronic): Secondary | ICD-10-CM

## 2023-07-18 DIAGNOSIS — L309 Dermatitis, unspecified: Secondary | ICD-10-CM

## 2023-07-18 LAB — CBC WITH DIFFERENTIAL/PLATELET
Basophils Absolute: 0.1 10*3/uL (ref 0.0–0.1)
Basophils Relative: 1.3 % (ref 0.0–3.0)
Eosinophils Absolute: 0.5 10*3/uL (ref 0.0–0.7)
Eosinophils Relative: 8.4 % — ABNORMAL HIGH (ref 0.0–5.0)
HCT: 44.9 % (ref 39.0–52.0)
Hemoglobin: 15.2 g/dL (ref 13.0–17.0)
Lymphocytes Relative: 23.2 % (ref 12.0–46.0)
Lymphs Abs: 1.4 10*3/uL (ref 0.7–4.0)
MCHC: 33.8 g/dL (ref 30.0–36.0)
MCV: 95.7 fl (ref 78.0–100.0)
Monocytes Absolute: 0.6 10*3/uL (ref 0.1–1.0)
Monocytes Relative: 10.2 % (ref 3.0–12.0)
Neutro Abs: 3.4 10*3/uL (ref 1.4–7.7)
Neutrophils Relative %: 56.9 % (ref 43.0–77.0)
Platelets: 336 10*3/uL (ref 150.0–400.0)
RBC: 4.69 Mil/uL (ref 4.22–5.81)
RDW: 12.8 % (ref 11.5–15.5)
WBC: 5.9 10*3/uL (ref 4.0–10.5)

## 2023-07-18 LAB — COMPREHENSIVE METABOLIC PANEL WITH GFR
ALT: 23 U/L (ref 0–53)
AST: 17 U/L (ref 0–37)
Albumin: 4.5 g/dL (ref 3.5–5.2)
Alkaline Phosphatase: 83 U/L (ref 39–117)
BUN: 15 mg/dL (ref 6–23)
CO2: 27 meq/L (ref 19–32)
Calcium: 9.3 mg/dL (ref 8.4–10.5)
Chloride: 104 meq/L (ref 96–112)
Creatinine, Ser: 0.91 mg/dL (ref 0.40–1.50)
GFR: 116.76 mL/min (ref >60.00)
Glucose, Bld: 92 mg/dL (ref 70–99)
Potassium: 4.1 meq/L (ref 3.5–5.1)
Sodium: 139 meq/L (ref 135–145)
Total Bilirubin: 0.4 mg/dL (ref 0.2–1.2)
Total Protein: 7.1 g/dL (ref 6.0–8.3)

## 2023-07-18 LAB — IBC + FERRITIN
Ferritin: 33.6 ng/mL (ref 22.0–322.0)
Iron: 69 ug/dL (ref 42–165)
Saturation Ratios: 15.2 % — ABNORMAL LOW (ref 20.0–50.0)
TIBC: 453.6 ug/dL — ABNORMAL HIGH (ref 250.0–450.0)
Transferrin: 324 mg/dL (ref 212.0–360.0)

## 2023-07-18 MED ORDER — SERTRALINE HCL 25 MG PO TABS: 25.0000 mg | ORAL_TABLET | Freq: Every day | ORAL | 3 refills | Status: DC

## 2023-07-18 MED ORDER — TRIAMCINOLONE ACETONIDE 0.5 % EX OINT: 1.0000 | TOPICAL_OINTMENT | Freq: Two times a day (BID) | CUTANEOUS | 2 refills | Status: DC | PRN

## 2023-07-18 NOTE — Progress Notes (Signed)
 [facility] [address] [place] [address] Dept: [phone] Dept Fax: [phone]  New Patient Office Visit  Subjective:   Gregory Clarke 1995/05/10 07/18/2023  Chief Complaint  Patient presents with   Establish Care   Rash    HPI: Gregory Clarke presents today to establish care at [facility] Introduced to Publishing rights manager role and practice setting.  All questions answered. He has been seen at a [place] facility within the past 3 years.  Concerns: See below   Discussed the use of AI scribe software for clinical note transcription with the patient, who gave verbal consent to proceed.  History of Present Illness   Gregory Clarke is a 28 year old male with asthma, ulcerative colitis, and eczema who presents to establish care.   He has asthma and uses an albuterol  inhaler as needed, typically once or twice a week, but up to three or four times a week during pollen allergy season. The inhaler alleviates symptoms of wheezing and difficulty breathing.  He experiences ulcerative colitis flare-ups characterized by frequent bathroom visits, sharp abdominal pain, and fatigue. Occasionally, he notices bloody or dark stools during these episodes. He has not been on medication for ulcerative colitis for some time, having previously been on sulfasalazine . Flare-ups occur about once every one to two months and last two to five days, often triggered by dietary factors. Not currently seen by GI.   He describes eczema as a persistent, itchy rash with bumps, primarily located in the folds of his skin, on his calves, shoulders, armpits, and back. The rash has been present for about five years. He uses triamcinolone  cream and has previously used hydroxyzine  and oral steroids, which have provided some relief.   He has a history of iron deficiency anemia and has taken over-the-counter iron supplements in  the past, particularly during ulcerative colitis flare-ups when he feels fatigued and shaky. Not currently on iron supplement.   He reports history of kidney stones, with one episode this year and two or three last year. He manages these episodes at home, using lemon juice in water  to help prevent recurrence.  He expresses concerns about depression and anxiety, noting a family history of these conditions. He experiences heavy anxiety and depression, which he attributes in part to his chronic health issues. He has previously been on fluoxetine during high school, which he felt dulled his emotions too much.          07/18/2023    8:53 AM  Depression screen PHQ 2/9  Decreased Interest 1  Down, Depressed, Hopeless 2  PHQ - 2 Score 3  Altered sleeping 3  Tired, decreased energy 2  Change in appetite 1  Feeling bad or failure about yourself  1  Trouble concentrating 2  Moving slowly or fidgety/restless 1  Suicidal thoughts 1  PHQ-9 Score 14  Difficult doing work/chores Somewhat difficult      07/18/2023    8:53 AM  GAD 7 : Generalized Anxiety Score  Nervous, Anxious, on Edge 2  Control/stop worrying 2  Worry too much - different things 2  Trouble relaxing 0  Restless 1  Easily annoyed or irritable 1  Afraid - awful might happen 2  Total GAD 7 Score 10  Anxiety Difficulty Somewhat difficult       The following portions of the patient's history were reviewed and updated as appropriate: past medical history, past surgical history, family history, social history, allergies, medications, and  problem list.   Patient Active Problem List   Diagnosis Date Noted   Rash and nonspecific skin eruption 01/11/2020   Ulcerative colitis (HCC) 07/23/2019   Bronchial asthma 07/23/2019   Mesenteric lymphadenopathy 03/19/2019   Iron deficiency anemia due to chronic blood loss 03/17/2019   IDA (iron deficiency anemia) 03/16/2019   Bloody diarrhea 03/16/2019   Foot fracture 09/09/2014   Past  Medical History:  Diagnosis Date   Anemia    Asthma    History of kidney stones    Kidney stones 03/19/2019   Ulcerative colitis [facility])    Past Surgical History:  Procedure Laterality Date   BIOPSY  03/31/2019   Procedure: BIOPSY;  Surgeon: Ruby Corporal, MD;  [facility];  Service: Endoscopy;;   COLONOSCOPY WITH PROPOFOL  N/A 03/31/2019   Procedure: COLONOSCOPY WITH PROPOFOL ;  Surgeon: Ruby Corporal, MD;  [facility];  Service: Endoscopy;  Laterality: N/A;  7:30   TONSILLECTOMY     Family History  Problem Relation Age of Onset   Healthy Mother    Healthy Father    Healthy Sister    Healthy Brother     Current Outpatient Medications:    albuterol  (VENTOLIN  HFA) 108 (90 Base) MCG/ACT inhaler, Inhale 1-2 puffs into the lungs every 6 (six) hours as needed for wheezing or shortness of breath., Disp: 18 g, Rfl: 2   albuterol  (VENTOLIN  HFA) 108 (90 Base) MCG/ACT inhaler, Inhale 2 puffs into the lungs every 6 (six) hours as needed for wheezing or shortness of breath., Disp: 8 g, Rfl: 0   ibuprofen  (ADVIL ) 200 MG tablet, Take 800-1,000 mg by mouth every 6 (six) hours as needed for headache or moderate pain., Disp: , Rfl:    sertraline (ZOLOFT) 25 MG tablet, Take 1 tablet (25 mg total) by mouth daily., Disp: 30 tablet, Rfl: 3   Spacer/Aero-Holding Chambers (AEROCHAMBER PLUS) inhaler, Use as instructed, Disp: 1 each, Rfl: 2   triamcinolone  ointment (KENALOG ) 0.5 %, Apply 1 Application topically 2 (two) times daily as needed (rash and itching)., Disp: 60 g, Rfl: 2 No Known Allergies  ROS: A complete ROS was performed with pertinent positives/negatives noted in the HPI. The remainder of the ROS are negative.   Objective:   Today's Vitals   07/18/23 0817  BP: 128/86  Pulse: 77  Temp: 98.4 F (36.9 C)  TempSrc: Temporal  SpO2: 99%  Weight: 224 lb 9.6 oz (101.9 kg)  Height: 6\' 2"  (1.88 m)    GENERAL: Well-appearing, in NAD. Well nourished.  SKIN: Pink,  warm and dry. Erythematous maculopapular rash to arms, lower extremities, back. Excoriation on arms and legs.  NECK: Trachea midline. Full ROM w/o pain or tenderness. No lymphadenopathy.  RESPIRATORY: Chest wall symmetrical. Respirations even and non-labored. Breath sounds clear to auscultation bilaterally.  CARDIAC: S1, S2 present, regular rate and rhythm. Peripheral pulses 2+ bilaterally.  MSK: Muscle tone and strength appropriate for age.  EXTREMITIES: Without clubbing, cyanosis, or edema.  NEUROLOGIC:  Steady, even gait.  PSYCH/MENTAL STATUS: Alert, oriented x 3. Cooperative, appropriate mood and affect.   Health Maintenance Due  Topic Date Due   HPV VACCINES (1 - Male 3-dose series) Never done   HIV Screening  Never done   Pneumococcal Vaccine 37-65 Years old (1 of 2 - PCV) Never done    No results found for any visits on 07/18/23.  Assessment & Plan:  Assessment and Plan    Asthma Intermittent asthma with increased albuterol  use  during pollen season. No current use of daily inhalers. - Continue albuterol  inhaler as needed. - Consider dermatology referral for potential biologic treatment addressing both asthma and eczema.  Eczema Chronic eczema with pruritus and scarring. Possible allergic component with facial swelling. Discussed potential use of biologics like Dupixent for combined treatment of eczema and asthma. - Prescribe triamcinolone  cream for eczema. - Refer to dermatology for evaluation and potential biologic treatment. - Advise lukewarm oatmeal baths for symptomatic relief.  Ulcerative colitis Chronic ulcerative colitis with flare-ups every one to two months. Previous treatment with sulfasalazine  discontinued due to suspected adverse effects. No current medication. - Refer to gastroenterology for evaluation and management. - Complete FMLA paperwork for work absences during flare-ups.  Iron deficiency anemia Iron deficiency anemia, possibly related to ulcerative  colitis. Symptoms include fatigue and muscle fatigue during UC flare-ups. - Order CBC and CMP to assess current iron levels and blood counts. - Evaluate need for continued iron supplementation based on lab results.  Depression and anxiety Depression and anxiety with recent exacerbation. Previous fluoxetine trial resulted in emotional blunting. He expresses interest in medication management. - Prescribe sertraline 25 mg once daily, with option to take in the morning or evening. - Follow-up in six weeks to assess response and adjust dosage if necessary.  Kidney stones Recurrent nephrolithiasis, with one episode this year and two to three episodes last year. - Advise on the use of lemon juice in water  to potentially prevent stone formation.         Orders Placed This Encounter  Procedures   CBC with Differential/Platelet   Comp Met (CMET)   IBC + Ferritin   Ambulatory referral to Dermatology    Referral Priority:   Routine    Referral Type:   Consultation    Referral Reason:   Specialty Services Required    Requested Specialty:   Dermatology    Number of Visits Requested:   1   Ambulatory referral to Gastroenterology    Referral Priority:   Routine    Referral Type:   Consultation    Referral Reason:   Specialty Services Required    Number of Visits Requested:   1   Meds ordered this encounter  Medications   triamcinolone  ointment (KENALOG ) 0.5 %    Sig: Apply 1 Application topically 2 (two) times daily as needed (rash and itching).    Dispense:  60 g    Refill:  2    Supervising Provider:   THOMPSON, AARON B [[id number]]   sertraline (ZOLOFT) 25 MG tablet    Sig: Take 1 tablet (25 mg total) by mouth daily.    Dispense:  30 tablet    Refill:  3    Supervising Provider:   Catheryn Cluck [[id number]]    Return in about 6 weeks (around 08/29/2023) for Anxiety/Depression.   Gavin Kast, FNP

## 2023-07-18 NOTE — Patient Instructions (Signed)
 [facility] Locations: [address], [place], [place] [address] Phone: [phone]   [address] [place], [place] [address] Phone: [phone]

## 2023-07-23 ENCOUNTER — Ambulatory Visit: Payer: Self-pay | Admitting: Internal Medicine

## 2023-07-23 NOTE — Telephone Encounter (Signed)
 Patient informed FMLA forms are ready, patient requested forms be faxed and a copy mailed to patients home.

## 2023-07-25 ENCOUNTER — Telehealth: Payer: Self-pay

## 2023-07-25 NOTE — Telephone Encounter (Signed)
 Patient informed fax # needed, patient will return call tomorrow to give fax #

## 2023-08-02 ENCOUNTER — Ambulatory Visit: Admitting: Internal Medicine

## 2023-08-21 ENCOUNTER — Encounter: Payer: Self-pay | Admitting: Internal Medicine

## 2023-08-23 ENCOUNTER — Ambulatory Visit: Admitting: Internal Medicine

## 2023-08-23 VITALS — BP 110/74 | HR 86 | Temp 98.4°F | Ht 74.0 in | Wt 225.8 lb

## 2023-08-23 DIAGNOSIS — F411 Generalized anxiety disorder: Secondary | ICD-10-CM | POA: Diagnosis not present

## 2023-08-23 DIAGNOSIS — F339 Major depressive disorder, recurrent, unspecified: Secondary | ICD-10-CM | POA: Diagnosis not present

## 2023-08-23 MED ORDER — SERTRALINE HCL 25 MG PO TABS: 25.0000 mg | ORAL_TABLET | Freq: Every day | ORAL | 3 refills | Status: DC

## 2023-08-23 MED ORDER — BUSPIRONE HCL 10 MG PO TABS: 10.0000 mg | ORAL_TABLET | Freq: Three times a day (TID) | ORAL | 3 refills | Status: AC | PRN

## 2023-08-23 NOTE — Progress Notes (Signed)
 [facility] [address] [place] [address] Dept: [phone] Dept Fax: [phone]    Subjective:   Gregory Clarke April 21, 1995 08/23/2023  Chief Complaint  Patient presents with   Follow-up    HPI: Gregory Clarke presents today for re-assessment and management of chronic medical conditions.  ANXIETY and DEPRESSION: Vicent Febles presents for the medical management of anxiety.  Current medication regimen: Zoloft  25mg  PO daily  Well controlled: yes, but reports having some anxiety/panic  attacks still.  Denies SI/HI.      08/23/2023    8:29 AM 07/18/2023    8:53 AM  PHQ9 SCORE ONLY  PHQ-9 Total Score 0 14        08/23/2023    8:29 AM 07/18/2023    8:53 AM  Depression screen PHQ 2/9  Decreased Interest 0 1  Down, Depressed, Hopeless 0 2  PHQ - 2 Score 0 3  Altered sleeping  3  Tired, decreased energy  2  Change in appetite  1  Feeling bad or failure about yourself   1  Trouble concentrating  2  Moving slowly or fidgety/restless  1  Suicidal thoughts  1  PHQ-9 Score  14  Difficult doing work/chores  Somewhat difficult       The following portions of the patient's history were reviewed and updated as appropriate: past medical history, past surgical history, family history, social history, allergies, medications, and problem list.   Patient Active Problem List   Diagnosis Date Noted   Rash and nonspecific skin eruption 01/11/2020   Ulcerative colitis (HCC) 07/23/2019   Bronchial asthma 07/23/2019   Mesenteric lymphadenopathy 03/19/2019   Iron deficiency anemia due to chronic blood loss 03/17/2019   IDA (iron deficiency anemia) 03/16/2019   Bloody diarrhea 03/16/2019   Foot fracture 09/09/2014   Past Medical History:  Diagnosis Date   Anemia    Asthma    History of kidney stones    Kidney stones 03/19/2019   Ulcerative colitis [facility])    Past Surgical History:  Procedure Laterality  Date   BIOPSY  03/31/2019   Procedure: BIOPSY;  Surgeon: Ruby Corporal, MD;  [facility];  Service: Endoscopy;;   COLONOSCOPY WITH PROPOFOL  N/A 03/31/2019   Procedure: COLONOSCOPY WITH PROPOFOL ;  Surgeon: Ruby Corporal, MD;  Location[facility];  Service: Endoscopy;  Laterality: N/A;  7:30   TONSILLECTOMY     Family History  Problem Relation Age of Onset   Healthy Mother    Healthy Father    Healthy Sister    Healthy Brother     Current Outpatient Medications:    albuterol  (VENTOLIN  HFA) 108 (90 Base) MCG/ACT inhaler, Inhale 1-2 puffs into the lungs every 6 (six) hours as needed for wheezing or shortness of breath., Disp: 18 g, Rfl: 2   busPIRone (BUSPAR) 10 MG tablet, Take 1 tablet (10 mg total) by mouth 3 (three) times daily as needed (panic attacks)., Disp: 90 tablet, Rfl: 3   ibuprofen  (ADVIL ) 200 MG tablet, Take 800-1,000 mg by mouth every 6 (six) hours as needed for headache or moderate pain., Disp: , Rfl:    triamcinolone  ointment (KENALOG ) 0.5 %, Apply 1 Application topically 2 (two) times daily as needed (rash and itching)., Disp: 60 g, Rfl: 2   albuterol  (VENTOLIN  HFA) 108 (90 Base) MCG/ACT inhaler, Inhale 2 puffs into the lungs every 6 (six) hours as needed for wheezing or shortness of breath., Disp: 8 g, Rfl: 0  sertraline  (ZOLOFT ) 25 MG tablet, Take 1 tablet (25 mg total) by mouth daily., Disp: 90 tablet, Rfl: 3   Spacer/Aero-Holding Chambers (AEROCHAMBER PLUS) inhaler, Use as instructed, Disp: 1 each, Rfl: 2 No Known Allergies   ROS: A complete ROS was performed with pertinent positives/negatives noted in the HPI. The remainder of the ROS are negative.    Objective:   Today's Vitals   08/23/23 0829  BP: 110/74  Pulse: 86  Temp: 98.4 F (36.9 C)  TempSrc: Temporal  SpO2: 98%  Weight: 225 lb 12.8 oz (102.4 kg)  Height: 6' 2 (1.88 m)    GENERAL: Well-appearing, in NAD. Well nourished.  SKIN: Pink, warm and dry.  RESPIRATORY:  Respirations  even and non-labored.  NEUROLOGIC:  Steady, even gait.  PSYCH/MENTAL STATUS: Alert, oriented x 3. Cooperative, appropriate mood and affect.   Health Maintenance Due  Topic Date Due   HPV VACCINES (1 - Male 3-dose series) Never done   HIV Screening  Never done   Pneumococcal Vaccine 26-26 Years old (1 of 2 - PCV) Never done    No results found for any visits on 08/23/23.  The ASCVD Risk score (Arnett DK, et al., 2019) failed to calculate for the following reasons:   The 2019 ASCVD risk score is only valid for ages 26 to 26     Assessment & Plan:  1. Generalized anxiety disorder (Primary) - busPIRone (BUSPAR) 10 MG tablet; Take 1 tablet (10 mg total) by mouth 3 (three) times daily as needed (panic attacks).  Dispense: 90 tablet; Refill: 3 - sertraline  (ZOLOFT ) 25 MG tablet; Take 1 tablet (25 mg total) by mouth daily.  Dispense: 90 tablet; Refill: 3 - mood improved with Zoloft  25mg , continue as prescribed.   2. Depression, recurrent (HCC) - sertraline  (ZOLOFT ) 25 MG tablet; Take 1 tablet (25 mg total) by mouth daily.  Dispense: 90 tablet; Refill: 3  No orders of the defined types were placed in this encounter.  No images are attached to the encounter or orders placed in the encounter. Meds ordered this encounter  Medications   busPIRone (BUSPAR) 10 MG tablet    Sig: Take 1 tablet (10 mg total) by mouth 3 (three) times daily as needed (panic attacks).    Dispense:  90 tablet    Refill:  3    Supervising Provider:   THOMPSON, AARON B [[id number]]   sertraline  (ZOLOFT ) 25 MG tablet    Sig: Take 1 tablet (25 mg total) by mouth daily.    Dispense:  90 tablet    Refill:  3    Supervising Provider:   THOMPSON, AARON B [[id number]]    Return in about 6 months (around 02/22/2024) for Annual Physical Exam with fasting lab work.   Gavin Kast, FNP

## 2023-10-24 ENCOUNTER — Other Ambulatory Visit: Payer: Self-pay | Admitting: *Deleted

## 2023-11-01 ENCOUNTER — Encounter: Payer: Self-pay | Admitting: Physician Assistant

## 2023-11-12 ENCOUNTER — Other Ambulatory Visit: Payer: Self-pay

## 2023-11-12 ENCOUNTER — Encounter (HOSPITAL_COMMUNITY): Payer: Self-pay

## 2023-11-12 ENCOUNTER — Emergency Department (HOSPITAL_COMMUNITY)
Admission: EM | Admit: 2023-11-12 | Discharge: 2023-11-13 | Disposition: A | Discharge status: Home / Self Care | Attending: Emergency Medicine | Admitting: Emergency Medicine

## 2023-11-12 DIAGNOSIS — K51919 Ulcerative colitis, unspecified with unspecified complications: Secondary | ICD-10-CM | POA: Diagnosis not present

## 2023-11-12 DIAGNOSIS — R109 Unspecified abdominal pain: Secondary | ICD-10-CM | POA: Diagnosis present

## 2023-11-12 LAB — CBC
HCT: 43.2 % (ref 39.0–52.0)
Hemoglobin: 14.9 g/dL (ref 13.0–17.0)
MCH: 32.3 pg (ref 26.0–34.0)
MCHC: 34.5 g/dL (ref 30.0–36.0)
MCV: 93.7 fL (ref 80.0–100.0)
Platelets: 336 K/uL (ref 150–400)
RBC: 4.61 MIL/uL (ref 4.22–5.81)
RDW: 12.6 % (ref 11.5–15.5)
WBC: 11.9 K/uL — ABNORMAL HIGH (ref 4.0–10.5)
nRBC: 0 % (ref 0.0–0.2)

## 2023-11-12 LAB — COMPREHENSIVE METABOLIC PANEL WITH GFR
ALT: 32 U/L (ref 0–44)
AST: 25 U/L (ref 15–41)
Albumin: 4.2 g/dL (ref 3.5–5.0)
Alkaline Phosphatase: 75 U/L (ref 38–126)
Anion gap: 11 (ref 5–15)
BUN: 9 mg/dL (ref 6–20)
CO2: 24 mmol/L (ref 22–32)
Calcium: 9.4 mg/dL (ref 8.9–10.3)
Chloride: 105 mmol/L (ref 98–111)
Creatinine, Ser: 1.04 mg/dL (ref 0.61–1.24)
GFR, Estimated: >60 mL/min (ref >60)
Glucose, Bld: 93 mg/dL (ref 70–99)
Potassium: 3.8 mmol/L (ref 3.5–5.1)
Sodium: 140 mmol/L (ref 135–145)
Total Bilirubin: 0.6 mg/dL (ref 0.0–1.2)
Total Protein: 7.5 g/dL (ref 6.5–8.1)

## 2023-11-12 LAB — TYPE AND SCREEN
ABO/RH(D): O POS
Antibody Screen: NEGATIVE

## 2023-11-12 NOTE — ED Triage Notes (Signed)
 Pt c/o abdominal pain. Hx of ulcerative colitis flare up x4 weeks. States he has been having rectal bleeding x4 weeks that is bright red. States he is now getting lightheaded when standing.

## 2023-11-12 NOTE — ED Provider Notes (Signed)
  [place] EMERGENCY DEPARTMENT AT [facility] Provider Note   CSN: [id number] Arrival date & time: 11/12/23  2224     History Chief Complaint  Patient presents with   Rectal Bleeding   Abdominal Pain    HPI Gregory Clarke is a 28 y.o. male presenting for chief complaint of abdominal pain and cramping and now worsening back pain. HX of UC not on medications right now Used to be on Sulfasalazine  but lost to follow up.  Used to see Dr. Elex but Emmalene is to see next week  Patient's recorded medical, surgical, social, medication list and allergies were reviewed in the Snapshot window as part of the initial history.   Review of Systems   Review of Systems  Physical Exam Updated Vital Signs BP (!) 135/102 (BP Location: Right Arm)   Pulse (!) 118   Temp 97.8 F (36.6 C) (Oral)   Resp 18   SpO2 99%  Physical Exam   ED Course/ Medical Decision Making/ A&P    Procedures Procedures   Medications Ordered in ED Medications - No data to display  Medical Decision Making:   [name] is a 28 y.o. male who presented to the ED today with *** detailed above.    {crccomplexity:27900} Complete initial physical exam performed, notably the patient  was ***.    Reviewed and confirmed nursing documentation for past medical history, family history, social history.    Initial Assessment:   With the patient's presentation of ***, most likely diagnosis is ***. Other diagnoses were considered including (but not limited to) ***. These are considered less likely due to history of present illness and physical exam findings.   {crccopa:27899}  Initial Plan:  ***  ***Screening labs including CBC and Metabolic panel to evaluate for infectious or metabolic etiology of disease.  ***Urinalysis with reflex culture ordered to evaluate for UTI or relevant urologic/nephrologic pathology.  ***CXR to evaluate for structural/infectious intrathoracic pathology.   {crccardiactesting:32591::EKG to evaluate for cardiac pathology} Objective evaluation as below reviewed   Initial Study Results:   Laboratory  All laboratory results reviewed without evidence of clinically relevant pathology.   ***Exceptions include: ***   ***EKG EKG was reviewed independently. Rate, rhythm, axis, intervals all examined and without medically relevant abnormality. ST segments without concerns for elevations.    Radiology:  All images reviewed independently. ***Agree with radiology report at this time.   No results found.    Consults: Case discussed with ***.   Reassessment and Plan:   ***    ***  Clinical Impression: No diagnosis found.   Data Unavailable   Final Clinical Impression(s) / ED Diagnoses Final diagnoses:  None    Rx / DC Orders ED Discharge Orders     None

## 2023-11-13 ENCOUNTER — Emergency Department (HOSPITAL_COMMUNITY)

## 2023-11-13 LAB — C DIFFICILE QUICK SCREEN W PCR REFLEX
C Diff antigen: NEGATIVE
C Diff interpretation: NOT DETECTED
C Diff toxin: NEGATIVE

## 2023-11-13 MED ORDER — PREDNISONE 10 MG (21) PO TBPK: ORAL_TABLET | Freq: Every day | ORAL | 0 refills | Status: DC

## 2023-11-13 MED ORDER — LACTATED RINGERS IV BOLUS
1000.0000 mL | Freq: Once | INTRAVENOUS | Status: AC
  Administered 2023-11-13: 1000 mL via INTRAVENOUS

## 2023-11-13 MED ORDER — METHOCARBAMOL 500 MG PO TABS: 500.0000 mg | ORAL_TABLET | Freq: Two times a day (BID) | ORAL | 0 refills | Status: AC

## 2023-11-13 MED ORDER — IOHEXOL 300 MG/ML  SOLN
100.0000 mL | Freq: Once | INTRAMUSCULAR | Status: AC | PRN
  Administered 2023-11-13: 100 mL via INTRAVENOUS

## 2023-11-13 MED ORDER — ACETAMINOPHEN 500 MG PO TABS
1000.0000 mg | ORAL_TABLET | Freq: Once | ORAL | Status: AC
  Administered 2023-11-13: 1000 mg via ORAL
  Filled 2023-11-13: qty 2

## 2023-11-13 MED ORDER — METHOCARBAMOL 500 MG PO TABS
1000.0000 mg | ORAL_TABLET | Freq: Once | ORAL | Status: AC
  Administered 2023-11-13: 1000 mg via ORAL
  Filled 2023-11-13: qty 2

## 2023-11-13 MED ORDER — METHYLPREDNISOLONE SODIUM SUCC 125 MG IJ SOLR
60.0000 mg | Freq: Once | INTRAMUSCULAR | Status: AC
  Administered 2023-11-13: 60 mg via INTRAVENOUS
  Filled 2023-11-13: qty 2

## 2023-11-13 MED ORDER — ONDANSETRON HCL 4 MG/2ML IJ SOLN
4.0000 mg | Freq: Once | INTRAMUSCULAR | Status: AC
  Administered 2023-11-13: 4 mg via INTRAVENOUS
  Filled 2023-11-13: qty 2

## 2023-11-14 LAB — GASTROINTESTINAL PANEL BY PCR, STOOL (REPLACES STOOL CULTURE)

## 2023-11-21 NOTE — Progress Notes (Unsigned)
 Ellouise Console, PA-C [address] [place], [place]  [address] Phone: [phone]   Gastroenterology Consultation  Referring Provider:     Billy Knee, FNP Primary Care Physician:  Billy Knee, FNP Primary Gastroenterologist:  Ellouise Console, PA-C / Glendia Holt, MD  Reason for Consultation:     Ulcerative pancolitis with rectal bleeding        HPI:   Gregory Clarke is a 28 y.o. y/o male referred for consultation & management  by Billy Knee, FNP.    Previous patient at [facility].  Requesting transfer of care to [facility].  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  Patient states sulfasalazine  worked, however they caused side effects.  Sulfasalazine  made him feel bad and shaky.  Later was treated with Lialda  in 2022, however he did not take it long.  Lost to follow-up after that.  Current symptoms: He has had recurrent rectal bleeding, diarrhea, lower abdominal pain, and decreased appetite for several months.  He noticed a flareup of skin rashes and has history of eczema.  Also notes joint pains and muscle fatigue.  He is having current active flare of UC.  Was recently seen at [facility], ED 11/12/2023 for rectal bleeding and ulcerative colitis.  He was started on Sterapred prednisone  taper 60 mg daily for 2 days, then 50 mg x 2 day, then 40mg  x 2 day, then 30mg  x 2 day, then 20mg  x 2 day, then 10mg  x 2 days.  On prednisone  he has had less bleeding and stools are more formed.  His eczema rash has improved.  He denies fever, chills, nausea, or vomiting.  11/13/2023 abdominal pelvis with contrast: 1. Circumferential wall thickening of the descending colon worrisome for colitis. 2. Nonobstructing bilateral renal calculi.  11/15/2023: Negative C. difficile stool test.  Negative GI pathogen panel.  WBC 11.9, hemoglobin 14.9, platelet 336.  Normal  CMP.  03/2019 colonoscopy: Moderately active Mayo score 2 ulcerative colitis to the hepatic flexure, new diagnosis.  Ascending colon, cecum, and ileum were normal.  Past Medical History:  Diagnosis Date   Anemia    Asthma    History of kidney stones    Kidney stones 03/19/2019   Ulcerative colitis [facility])     Past Surgical History:  Procedure Laterality Date   BIOPSY  03/31/2019   Procedure: BIOPSY;  Surgeon: Golda Claudis PENNER, MD;  [facility];  Service: Endoscopy;;   COLONOSCOPY WITH PROPOFOL  N/A 03/31/2019   Procedure: COLONOSCOPY WITH PROPOFOL ;  Surgeon: Golda Claudis PENNER, MD;  [facility];  Service: Endoscopy;  Laterality: N/A;  7:30   TONSILLECTOMY      Prior to Admission medications   Medication Sig Start Date End Date Taking? Authorizing Provider  albuterol  (VENTOLIN  HFA) 108 (90 Base) MCG/ACT inhaler Inhale 1-2 puffs into the lungs every 6 (six) hours as needed for wheezing or shortness of breath. 02/24/23   Lavell Bari LABOR, FNP  albuterol  (VENTOLIN  HFA) 108 (90 Base) MCG/ACT inhaler Inhale 2 puffs into the lungs every 6 (six) hours as needed for wheezing or shortness of breath. 05/12/23   Blair, Diane W, FNP  busPIRone  (BUSPAR ) 10 MG tablet Take 1 tablet (10 mg total) by mouth 3 (three) times daily as needed (panic attacks). 08/23/23   Billy Knee, FNP  ibuprofen  (ADVIL ) 200 MG tablet Take 800-1,000 mg by mouth every 6 (six) hours as needed for  headache or moderate pain.    [provider]  methocarbamol  (ROBAXIN ) 500 MG tablet Take 1 tablet (500 mg total) by mouth 2 (two) times daily. 11/13/23   Jerral Meth, MD  predniSONE  (STERAPRED UNI-PAK 21 TAB) 10 MG (21) TBPK tablet Take by mouth daily. Take 6 tabs by mouth daily  for 2 days, then 5 tabs for 2 days, then 4 tabs for 2 days, then 3 tabs for 2 days, 2 tabs for 2 days, then 1 tab by mouth daily for 2 days 11/13/23   Jerral Meth, MD  sertraline  (ZOLOFT ) 25 MG tablet Take 1 tablet (25 mg  total) by mouth daily. 08/23/23   Billy Knee, FNP  Spacer/Aero-Holding Chambers (AEROCHAMBER PLUS) inhaler Use as instructed 04/07/21   Van Knee, MD  triamcinolone  ointment (KENALOG ) 0.5 % Apply 1 Application topically 2 (two) times daily as needed (rash and itching). 07/18/23   Billy Knee, FNP    Family History  Problem Relation Age of Onset   Healthy Mother    Healthy Father    Healthy Sister    Healthy Brother      Social History   Tobacco Use   Smoking status: Never   Smokeless tobacco: Current  Vaping Use   Vaping status: Former  Substance Use Topics   Alcohol use: No   Drug use: Yes    Types: Marijuana    Comment: 03/26/19    Allergies as of 11/22/2023   (No Known Allergies)    Review of Systems:    All systems reviewed and negative except where noted in HPI.   Physical Exam:  BP 108/62   Pulse 96   Ht 6' 2 (1.88 m)   Wt 220 lb (99.8 kg)   SpO2 97%   BMI 28.25 kg/m  No LMP for male patient.  General:   Alert,  Well-developed, well-nourished, pleasant and cooperative in NAD Lungs:  Respirations even and unlabored.  Clear throughout to auscultation.   No wheezes, crackles, or rhonchi. No acute distress. Heart:  Regular rate and rhythm; no murmurs, clicks, rubs, or gallops. Abdomen:  Normal bowel sounds.  No bruits.  Soft, and non-distended without masses, hepatosplenomegaly or hernias noted.  Mild RLQ and LLQ Lower abdominal Tenderness.  No upper abdominal tenderness.  No guarding or rebound tenderness.    Neurologic:  Alert and oriented x3;  grossly normal neurologically. Psych:  Alert and cooperative. Normal mood and affect.  Imaging Studies: CT ABDOMEN PELVIS W CONTRAST Result Date: 11/13/2023 CLINICAL DATA:  Acute abdominal pain. EXAM: CT ABDOMEN AND PELVIS WITH CONTRAST TECHNIQUE: Multidetector CT imaging of the abdomen and pelvis was performed using the standard protocol following bolus administration of intravenous contrast. RADIATION DOSE  REDUCTION: This exam was performed according to the departmental dose-optimization program which includes automated exposure control, adjustment of the mA and/or kV according to patient size and/or use of iterative reconstruction technique. CONTRAST:  OMNIPAQUE  IOHEXOL  300 MG/ML  SOLN COMPARISON:  CT abdomen and pelvis 07/06/2021 FINDINGS: Lower chest: No acute abnormality. Hepatobiliary: No focal liver abnormality is seen. No gallstones, gallbladder wall thickening, or biliary dilatation. Pancreas: Unremarkable. No pancreatic ductal dilatation or surrounding inflammatory changes. Spleen: Normal in size without focal abnormality. Adrenals/Urinary Tract: There are punctate nonobstructing bilateral renal calculi. There subcentimeter hypodensities in the right kidney which are too small to characterize, likely cysts. The adrenal glands and bladder are within normal limits. Stomach/Bowel: There circumferential wall thickening of the descending colon without surrounding inflammation. There is no  bowel obstruction, pneumatosis or free air. The appendix, small bowel and stomach are within normal limits. Vascular/Lymphatic: No significant vascular findings are present. No enlarged abdominal or pelvic lymph nodes. Reproductive: Prostate is unremarkable. Other: No abdominal wall hernia or abnormality. No abdominopelvic ascites. Musculoskeletal: No acute or significant osseous findings. IMPRESSION: 1. Circumferential wall thickening of the descending colon worrisome for colitis. 2. Nonobstructing bilateral renal calculi. Electronically Signed   By: Greig Pique M.D.   On: 11/13/2023 01:33    Labs: CBC    Component Value Date/Time   WBC 11.9 (H) 11/12/2023 2246   RBC 4.61 11/12/2023 2246   HGB 14.9 11/12/2023 2246   HGB 15.1 02/02/2023 1425   HCT 43.2 11/12/2023 2246   HCT 43.4 02/02/2023 1425   PLT 336 11/12/2023 2246   PLT 333 02/02/2023 1425   MCV 93.7 11/12/2023 2246   MCV 93 02/02/2023 1425     CMP     Component Value Date/Time   NA 140 11/12/2023 2246   NA 138 02/02/2023 1425   K 3.8 11/12/2023 2246   CL 105 11/12/2023 2246   CO2 24 11/12/2023 2246   GLUCOSE 93 11/12/2023 2246   BUN 9 11/12/2023 2246   BUN 11 02/02/2023 1425   CREATININE 1.04 11/12/2023 2246   CALCIUM 9.4 11/12/2023 2246   PROT 7.5 11/12/2023 2246   PROT 7.1 02/02/2023 1425   ALBUMIN 4.2 11/12/2023 2246   ALBUMIN 4.7 02/02/2023 1425   AST 25 11/12/2023 2246   ALT 32 11/12/2023 2246   ALKPHOS 75 11/12/2023 2246   BILITOT 0.6 11/12/2023 2246   BILITOT 0.2 02/02/2023 1425   GFRNONAA >60 11/12/2023 2246   GFRAA >60 08/29/2019 1637    Assessment and Plan:   Bannon Giammarco is a 28 y.o. y/o male has been referred for :  Ulcerative Colitis flare Rectal Bleeding Diarrhea: Recent C. difficile and GI Pathogen Panal Negative  Plan: - Rx Lialda  1.2 g take 4 tablets every morning, #120, 5 RF. - Finish prednisone  taper. - Scheduling repeat Colonoscopy I discussed risks of colonoscopy with patient to include risk of bleeding, colon perforation, and risk of sedation.  Patient expressed understanding and agrees to proceed with colonoscopy.  - Fecal Calprotectin - Labs: CBC, CMP, CRP, TB Quanteferon Gold - Stop ibuprofen .  Avoid NSAIDs.  Follow up after colonoscopy with MD.  Ellouise Console, PA-C

## 2023-11-22 ENCOUNTER — Other Ambulatory Visit (INDEPENDENT_AMBULATORY_CARE_PROVIDER_SITE_OTHER)

## 2023-11-22 ENCOUNTER — Encounter: Payer: Self-pay | Admitting: Physician Assistant

## 2023-11-22 ENCOUNTER — Ambulatory Visit: Admitting: Physician Assistant

## 2023-11-22 ENCOUNTER — Ambulatory Visit: Payer: Self-pay | Admitting: Physician Assistant

## 2023-11-22 VITALS — BP 108/62 | HR 96 | Ht 74.0 in | Wt 220.0 lb

## 2023-11-22 DIAGNOSIS — R197 Diarrhea, unspecified: Secondary | ICD-10-CM | POA: Diagnosis not present

## 2023-11-22 DIAGNOSIS — K625 Hemorrhage of anus and rectum: Secondary | ICD-10-CM

## 2023-11-22 DIAGNOSIS — K51011 Ulcerative (chronic) pancolitis with rectal bleeding: Secondary | ICD-10-CM

## 2023-11-22 LAB — CBC WITH DIFFERENTIAL/PLATELET
Basophils Absolute: 0 K/uL (ref 0.0–0.1)
Basophils Relative: 0.4 % (ref 0.0–3.0)
Eosinophils Absolute: 0 K/uL (ref 0.0–0.7)
Eosinophils Relative: 0.5 % (ref 0.0–5.0)
HCT: 40.9 % (ref 39.0–52.0)
Hemoglobin: 13.9 g/dL (ref 13.0–17.0)
Lymphocytes Relative: 17.6 % (ref 12.0–46.0)
Lymphs Abs: 1.8 K/uL (ref 0.7–4.0)
MCHC: 34.1 g/dL (ref 30.0–36.0)
MCV: 94.4 fl (ref 78.0–100.0)
Monocytes Absolute: 0.8 K/uL (ref 0.1–1.0)
Monocytes Relative: 7.6 % (ref 3.0–12.0)
Neutro Abs: 7.6 K/uL (ref 1.4–7.7)
Neutrophils Relative %: 73.9 % (ref 43.0–77.0)
Platelets: 362 K/uL (ref 150.0–400.0)
RBC: 4.33 Mil/uL (ref 4.22–5.81)
RDW: 13.2 % (ref 11.5–15.5)
WBC: 10.3 K/uL (ref 4.0–10.5)

## 2023-11-22 LAB — COMPREHENSIVE METABOLIC PANEL WITH GFR
ALT: 30 U/L (ref 0–53)
AST: 15 U/L (ref 0–37)
Albumin: 4.4 g/dL (ref 3.5–5.2)
Alkaline Phosphatase: 62 U/L (ref 39–117)
BUN: 14 mg/dL (ref 6–23)
CO2: 26 meq/L (ref 19–32)
Calcium: 9.4 mg/dL (ref 8.4–10.5)
Chloride: 102 meq/L (ref 96–112)
Creatinine, Ser: 0.92 mg/dL (ref 0.40–1.50)
GFR: 114.96 mL/min (ref >60.00)
Glucose, Bld: 98 mg/dL (ref 70–99)
Potassium: 3.6 meq/L (ref 3.5–5.1)
Sodium: 137 meq/L (ref 135–145)
Total Bilirubin: 0.5 mg/dL (ref 0.2–1.2)
Total Protein: 7.3 g/dL (ref 6.0–8.3)

## 2023-11-22 LAB — C-REACTIVE PROTEIN: CRP: <1 mg/dL (ref 0.5–20.0)

## 2023-11-22 MED ORDER — MESALAMINE 1.2 G PO TBEC: 4.8000 g | DELAYED_RELEASE_TABLET | Freq: Every day | ORAL | 5 refills | Status: DC

## 2023-11-22 MED ORDER — NA SULFATE-K SULFATE-MG SULF 17.5-3.13-1.6 GM/177ML PO SOLN: 1.0000 | Freq: Once | ORAL | 0 refills | Status: AC

## 2023-11-22 NOTE — Patient Instructions (Signed)
 Your provider has requested that you go to the basement level for lab work before leaving today. Press B on the elevator. The lab is located at the first door on the left as you exit the elevator.  We have sent the following medications to your pharmacy for you to pick up at your convenience: Lialda  1.2 g 4 tablets every morning  You have been scheduled for a Colonoscopy. Please follow written instructions given to you at your visit today.   If you use inhalers (even only as needed), please bring them with you on the day of your procedure.  DO NOT TAKE 7 DAYS PRIOR TO TEST- Trulicity (dulaglutide) Ozempic, Wegovy (semaglutide) Mounjaro (tirzepatide) Bydureon Bcise (exanatide extended release)  DO NOT TAKE 1 DAY PRIOR TO YOUR TEST Rybelsus (semaglutide) Adlyxin (lixisenatide) Victoza (liraglutide) Byetta (exanatide) ___________________________________________________________________________  Please follow up sooner if symptoms increase or worsen   Due to recent changes in healthcare laws, you may see the results of your imaging and laboratory studies on MyChart before your provider has had a chance to review them.  We understand that in some cases there may be results that are confusing or concerning to you. Not all laboratory results come back in the same time frame and the provider may be waiting for multiple results in order to interpret others.  Please give us  48 hours in order for your provider to thoroughly review all the results before contacting the office for clarification of your results.   Thank you for trusting me with your gastrointestinal care!   Ellouise Console, PA-C _______________________________________________________  If your blood pressure at your visit was 140/90 or greater, please contact your primary care physician to follow up on this.  _______________________________________________________  If you are age 74 or older, your body mass index should be between  23-30. Your Body mass index is 28.25 kg/m. If this is out of the aforementioned range listed, please consider follow up with your Primary Care Provider.  If you are age 44 or younger, your body mass index should be between 19-25. Your Body mass index is 28.25 kg/m. If this is out of the aformentioned range listed, please consider follow up with your Primary Care Provider.   ________________________________________________________  The [facility] providers would like to encourage you to use MYCHART to communicate with providers for non-urgent requests or questions.  Due to long hold times on the telephone, sending your provider a message by [facility] may be a faster and more efficient way to get a response.  Please allow 48 business hours for a response.  Please remember that this is for non-urgent requests.  _______________________________________________________

## 2023-11-24 NOTE — Progress Notes (Signed)
 Agree with the assessment and plan as outlined by Brigitte Canard, PA-C.

## 2023-11-26 LAB — QUANTIFERON-TB GOLD PLUS
Mitogen-NIL: >10 [IU]/mL
NIL: 0.03 [IU]/mL
QuantiFERON-TB Gold Plus: NEGATIVE
TB1-NIL: <0 [IU]/mL
TB2-NIL: <0 [IU]/mL

## 2023-12-10 ENCOUNTER — Ambulatory Visit
Admission: RE | Admit: 2023-12-10 | Discharge: 2023-12-10 | Disposition: A | Discharge status: Home / Self Care | Attending: Nurse Practitioner | Admitting: Nurse Practitioner

## 2023-12-10 VITALS — BP 123/84 | HR 86 | Temp 97.6°F | Resp 18

## 2023-12-10 DIAGNOSIS — L2089 Other atopic dermatitis: Secondary | ICD-10-CM

## 2023-12-10 DIAGNOSIS — K611 Rectal abscess: Secondary | ICD-10-CM | POA: Diagnosis not present

## 2023-12-10 DIAGNOSIS — J4521 Mild intermittent asthma with (acute) exacerbation: Secondary | ICD-10-CM | POA: Diagnosis not present

## 2023-12-10 DIAGNOSIS — L309 Dermatitis, unspecified: Secondary | ICD-10-CM

## 2023-12-10 MED ORDER — TRIAMCINOLONE ACETONIDE 0.1 % EX CREA: 1.0000 | TOPICAL_CREAM | Freq: Two times a day (BID) | CUTANEOUS | 0 refills | Status: AC

## 2023-12-10 MED ORDER — AMOXICILLIN-POT CLAVULANATE 875-125 MG PO TABS: 1.0000 | ORAL_TABLET | Freq: Two times a day (BID) | ORAL | 0 refills | Status: AC

## 2023-12-10 MED ORDER — ALBUTEROL SULFATE HFA 108 (90 BASE) MCG/ACT IN AERS: 1.0000 | INHALATION_SPRAY | Freq: Four times a day (QID) | RESPIRATORY_TRACT | 0 refills | Status: DC | PRN

## 2023-12-10 MED ORDER — DEXAMETHASONE SODIUM PHOSPHATE 10 MG/ML IJ SOLN
10.0000 mg | Freq: Once | INTRAMUSCULAR | Status: AC
  Administered 2023-12-10: 10 mg via INTRAMUSCULAR

## 2023-12-10 NOTE — Discharge Instructions (Addendum)
 The pain and swelling around your rectum is consistent with a perirectal abscess.  Take the Augmentin  as prescribed to treat it.  Also recommend warm compresses and warm water  soaks to help the area drain.  If symptoms worsen despite treatment, please seek care emergently.  I have also sent refills of the steroid cream and albuterol  inhaler to the pharmacy.  Use these as needed and follow-up with your primary care provider if symptoms do not improve with the treatment.  We have given you a steroid shot today to help with the itching from the rash.    Please reach out to your GI provider to touch base on the steroid medicine and see if you need more.

## 2023-12-10 NOTE — ED Triage Notes (Signed)
 Thinks he has a hemorrhoid x 4 days.SABRA  Hx of ulcerative colitis.  States has been having a flare up.    States would also like a refill on albuterol  inhaler.

## 2023-12-10 NOTE — ED Provider Notes (Signed)
 [facility]    CSN: [id number] Arrival date & time: 12/10/23  1046      History   Chief Complaint Chief Complaint  Patient presents with   Hemorrhoids    Entered by patient    HPI Gregory Clarke is a 28 y.o. male.   Patient presents today for concern for possible hemorrhoid.  He reports a 4-day history of perirectal soreness, swelling, and heat.  Reports symptoms began shortly after stopping prednisone  for ulcerative colitis, is following closely with gastroenterology and has colonoscopy scheduled for later this month.  Endorses chills and nausea which is typical for him with ulcerative colitis flare which he has been dealing with for a couple of months.  He has been able to eat and drink okay.  He also endorses bloody stools which have not changed for the past couple of months and is closely being monitored by gastroenterology.  No significant change in appetite, recent unexplained weight, or vomiting.  No urinary symptoms.  He has taken ibuprofen  which does help take the edge off of the pain a little bit.  Patient also concerned about itchy rash behind knees, on both arms-has been told he has eczema in the past and recently ran out of cream that he has been given.  Also has noticed he is wheezing in the morning, reports history of asthma and ran out of albuterol  inhaler.  He denies significant cough or congestion but does feel little bit of chest tightness in the morning.  He is requesting a refill on the albuterol  inhaler today.  Reports he has never had to have a daily inhaler and states his breathing is well-controlled with intermittent albuterol  use- uses daily only when he has flares or when seasons change or he uses cleaning chemicals.    Past Medical History:  Diagnosis Date   Anemia    Asthma    History of kidney stones    Kidney stones 03/19/2019   Ulcerative colitis [facility])     Patient Active Problem List   Diagnosis Date Noted   Rash and nonspecific  skin eruption 01/11/2020   Ulcerative colitis (HCC) 07/23/2019   Bronchial asthma 07/23/2019   Mesenteric lymphadenopathy 03/19/2019   Iron deficiency anemia due to chronic blood loss 03/17/2019   IDA (iron deficiency anemia) 03/16/2019   Bloody diarrhea 03/16/2019   Foot fracture 09/09/2014    Past Surgical History:  Procedure Laterality Date   BIOPSY  03/31/2019   Procedure: BIOPSY;  Surgeon: Golda Claudis PENNER, MD;  [facility];  Service: Endoscopy;;   COLONOSCOPY WITH PROPOFOL  N/A 03/31/2019   Procedure: COLONOSCOPY WITH PROPOFOL ;  Surgeon: Golda Claudis PENNER, MD;  [facility];  Service: Endoscopy;  Laterality: N/A;  7:30   TONSILLECTOMY         Home Medications    Prior to Admission medications   Medication Sig Start Date End Date Taking? Authorizing Provider  amoxicillin -clavulanate (AUGMENTIN ) 875-125 MG tablet Take 1 tablet by mouth 2 (two) times daily for 10 days. 12/10/23 12/20/23 Yes Chandra Harlene LABOR, NP  triamcinolone  cream (KENALOG ) 0.1 % Apply 1 Application topically 2 (two) times daily. Do not use more than 14 days in a row 12/10/23  Yes Chandra Harlene LABOR, NP  albuterol  (VENTOLIN  HFA) 108 (90 Base) MCG/ACT inhaler Inhale 1-2 puffs into the lungs every 6 (six) hours as needed for wheezing or shortness of breath. 12/10/23   Chandra Harlene LABOR, NP  busPIRone  (BUSPAR ) 10 MG tablet Take 1 tablet (  10 mg total) by mouth 3 (three) times daily as needed (panic attacks). 08/23/23   Billy Knee, FNP  ibuprofen  (ADVIL ) 200 MG tablet Take 800-1,000 mg by mouth every 6 (six) hours as needed for headache or moderate pain.    [provider]  mesalamine  (LIALDA ) 1.2 g EC tablet Take 4 tablets (4.8 g total) by mouth daily with breakfast. 11/22/23 05/20/24  [place], PA-C  methocarbamol  (ROBAXIN ) 500 MG tablet Take 1 tablet (500 mg total) by mouth 2 (two) times daily. 11/13/23   Jerral Meth, MD  sertraline  (ZOLOFT ) 25 MG tablet Take 1 tablet (25 mg  total) by mouth daily. Patient not taking: Reported on 11/22/2023 08/23/23   Billy Knee, FNP  Spacer/Aero-Holding Chambers (AEROCHAMBER PLUS) inhaler Use as instructed 04/07/21   Van Knee, MD    Family History Family History  Problem Relation Age of Onset   Healthy Mother    Healthy Father    Healthy Sister    Healthy Brother     Social History Social History   Tobacco Use   Smoking status: Never   Smokeless tobacco: Current  Vaping Use   Vaping status: Former  Substance Use Topics   Alcohol use: No   Drug use: Yes    Types: Marijuana    Comment: 03/26/19     Allergies   Patient has no known allergies.   Review of Systems Review of Systems Per HPI  Physical Exam Triage Vital Signs ED Triage Vitals  Encounter Vitals Group     BP 12/10/23 1106 123/84     Girls Systolic BP Percentile --      Girls Diastolic BP Percentile --      Boys Systolic BP Percentile --      Boys Diastolic BP Percentile --      Pulse Rate 12/10/23 1106 86     Resp 12/10/23 1106 18     Temp 12/10/23 1106 97.6 F (36.4 C)     Temp Source 12/10/23 1106 Oral     SpO2 12/10/23 1106 95 %     Weight --      Height --      Head Circumference --      Peak Flow --      Pain Score 12/10/23 1108 6     Pain Loc --      Pain Education --      Exclude from Growth Chart --    No data found.  Updated Vital Signs BP 123/84 (BP Location: Right Arm)   Pulse 86   Temp 97.6 F (36.4 C) (Oral)   Resp 18   SpO2 95%   Visual Acuity Right Eye Distance:   Left Eye Distance:   Bilateral Distance:    Right Eye Near:   Left Eye Near:    Bilateral Near:     Physical Exam Vitals and nursing note reviewed. Exam conducted with a chaperone present Evonne Compton, CMA).  Constitutional:      General: He is not in acute distress.    Appearance: Normal appearance. He is not toxic-appearing.  HENT:     Mouth/Throat:     Mouth: Mucous membranes are moist.     Pharynx: Oropharynx is clear. No  oropharyngeal exudate or posterior oropharyngeal erythema.  Cardiovascular:     Rate and Rhythm: Normal rate and regular rhythm.  Pulmonary:     Effort: Pulmonary effort is normal. No respiratory distress.     Breath sounds: Normal breath sounds. No wheezing,  rhonchi or rales.  Abdominal:     General: Abdomen is flat.     Palpations: Abdomen is soft.  Genitourinary:     Comments: Erythematous lesion without active drainage or fluctuance. There is some surrounding erythema. Skin:    General: Skin is warm and dry.     Coloration: Skin is not jaundiced or pale.     Findings: Rash present. Rash is scaling.     Comments: Multiple scaly plaques noted to bilateral arms, bilateral popliteal area.  No surrounding erythema or active drainage.  Neurological:     Mental Status: He is alert and oriented to person, place, and time.  Psychiatric:        Behavior: Behavior is cooperative.      UC Treatments / Results  Labs (all labs ordered are listed, but only abnormal results are displayed) Labs Reviewed - No data to display  EKG   Radiology No results found.  Procedures Procedures (including critical care time)  Medications Ordered in UC Medications  dexamethasone  (DECADRON ) injection 10 mg (10 mg Intramuscular Given 12/10/23 1140)    Initial Impression / Assessment and Plan / UC Course  I have reviewed the triage vital signs and the nursing notes.  Pertinent labs & imaging results that were available during my care of the patient were reviewed by me and considered in my medical decision making (see chart for details).   Patient is well-appearing, normotensive, afebrile, not tachycardic, not tachypneic, oxygenating well on room air.   1. Abscess, perirectal I&D not performed today - shared patient decision making utilized; given complexity with ulcerative colitis and location of abscess, worrisome for tunneling Start Augmentin  twice daily for 10 days and warm  compresses Recommended close follow up with GI and ER precautions discussed if symptoms worsen in any way  2. Flexural atopic dermatitis Refill given for triamcinolone  Decadron  10 mg IM given today for rash/itching Follow up with PCP if flare does not improve within 14 days  3. Mild intermittent asthma with exacerbation Refill given for albuterol  Follow up with PCP if not improved within 1 week or using albuterol  inhaler more than normal   The patient was given the opportunity to ask questions.  All questions answered to their satisfaction.  The patient is in agreement to this plan.   Final Clinical Impressions(s) / UC Diagnoses   Final diagnoses:  Flexural atopic dermatitis  Abscess, perirectal  Mild intermittent asthma with acute exacerbation     Discharge Instructions      The pain and swelling around your rectum is consistent with a perirectal abscess.  Take the Augmentin  as prescribed to treat it.  Also recommend warm compresses and warm water  soaks to help the area drain.  If symptoms worsen despite treatment, please seek care emergently.  I have also sent refills of the steroid cream and albuterol  inhaler to the pharmacy.  Use these as needed and follow-up with your primary care provider if symptoms do not improve with the treatment.  We have given you a steroid shot today to help with the itching from the rash.    Please reach out to your GI provider to touch base on the steroid medicine and see if you need more.      ED Prescriptions     Medication Sig Dispense Auth. Provider   amoxicillin -clavulanate (AUGMENTIN ) 875-125 MG tablet Take 1 tablet by mouth 2 (two) times daily for 10 days. 20 tablet Chandra Raisin A, NP   triamcinolone  cream (KENALOG ) 0.1 %  Apply 1 Application topically 2 (two) times daily. Do not use more than 14 days in a row 453.6 g Chandra Raisin A, NP   albuterol  (VENTOLIN  HFA) 108 (90 Base) MCG/ACT inhaler Inhale 1-2 puffs into the lungs every  6 (six) hours as needed for wheezing or shortness of breath. 18 g Chandra Raisin LABOR, NP      PDMP not reviewed this encounter.   Chandra Raisin LABOR, NP 12/10/23 [phone]

## 2023-12-20 ENCOUNTER — Other Ambulatory Visit

## 2023-12-20 DIAGNOSIS — K51011 Ulcerative (chronic) pancolitis with rectal bleeding: Secondary | ICD-10-CM

## 2023-12-24 LAB — CALPROTECTIN, FECAL: Calprotectin, Fecal: 1080 ug/g — ABNORMAL HIGH (ref 0–120)

## 2023-12-27 ENCOUNTER — Telehealth: Payer: Self-pay | Admitting: Gastroenterology

## 2023-12-27 ENCOUNTER — Encounter: Payer: Self-pay | Admitting: Gastroenterology

## 2023-12-27 ENCOUNTER — Ambulatory Visit (AMBULATORY_SURGERY_CENTER): Admitting: Gastroenterology

## 2023-12-27 VITALS — BP 106/59 | HR 84 | Temp 98.6°F | Resp 20 | Ht 74.0 in | Wt 220.0 lb

## 2023-12-27 DIAGNOSIS — K51011 Ulcerative (chronic) pancolitis with rectal bleeding: Secondary | ICD-10-CM

## 2023-12-27 MED ORDER — SODIUM CHLORIDE 0.9 % IV SOLN: 500.0000 mL | Freq: Once | INTRAVENOUS | Status: DC

## 2023-12-27 NOTE — Progress Notes (Unsigned)
 Sedate, gd SR, tolerated procedure well, VSS, report to RN

## 2023-12-27 NOTE — Telephone Encounter (Signed)
 Returned pts call.  He states that he vomited after drinking the second half of the prep.  He says his stools are liquid but still brown.  He is still drinking the 16 oz water  at this time.  Advised him that if he is able to purchase  Miralax and mix that with gatorade before 1:30, that would help to further clean out his bowels.  Otherwise, I encouraged him to drink plenty of fluids up until 1:30.  Pt will call back if he has any further issues.

## 2023-12-27 NOTE — Patient Instructions (Signed)
 Resume previous diet.  Continue present medications.  Await pathology results.   Follow up in the office to discuss maintenance medications.   YOU HAD AN ENDOSCOPIC PROCEDURE TODAY AT THE [facility]:   Refer to the procedure report that was given to you for any specific questions about what was found during the examination.  If the procedure report does not answer your questions, please call your gastroenterologist to clarify.  If you requested that your care partner not be given the details of your procedure findings, then the procedure report has been included in a sealed envelope for you to review at your convenience later.  YOU SHOULD EXPECT: Some feelings of bloating in the abdomen. Passage of more gas than usual.  Walking can help get rid of the air that was put into your GI tract during the procedure and reduce the bloating. If you had a lower endoscopy (such as a colonoscopy or flexible sigmoidoscopy) you may notice spotting of blood in your stool or on the toilet paper. If you underwent a bowel prep for your procedure, you may not have a normal bowel movement for a few days.  Please Note:  You might notice some irritation and congestion in your nose or some drainage.  This is from the oxygen used during your procedure.  There is no need for concern and it should clear up in a day or so.  SYMPTOMS TO REPORT IMMEDIATELY:  Following lower endoscopy (colonoscopy or flexible sigmoidoscopy):  Excessive amounts of blood in the stool  Significant tenderness or worsening of abdominal pains  Swelling of the abdomen that is new, acute  Fever of 100F or higher  For urgent or emergent issues, a gastroenterologist can be reached at any hour by calling [phone]. Do not use MyChart messaging for urgent concerns.    DIET:  We do recommend a small meal at first, but then you may proceed to your regular diet.  Drink plenty of fluids but you should avoid alcoholic beverages for 24  hours.  ACTIVITY:  You should plan to take it easy for the rest of today and you should NOT DRIVE or use heavy machinery until tomorrow (because of the sedation medicines used during the test).    FOLLOW UP: Our staff will call the number listed on your records the next business day following your procedure.  We will call around 7:15- 8:00 am to check on you and address any questions or concerns that you may have regarding the information given to you following your procedure. If we do not reach you, we will leave a message.     If any biopsies were taken you will be contacted by phone or by letter within the next 1-3 weeks.  Please call us  at [phone] if you have not heard about the biopsies in 3 weeks.    SIGNATURES/CONFIDENTIALITY: You and/or your care partner have signed paperwork which will be entered into your electronic medical record.  These signatures attest to the fact that that the information above on your After Visit Summary has been reviewed and is understood.  Full responsibility of the confidentiality of this discharge information lies with you and/or your care-partner.

## 2023-12-27 NOTE — Progress Notes (Unsigned)
 [facility] History and Physical   Primary Care Physician:  Billy Knee, FNP   Reason for Procedure:   Ulcerative colitis  Plan:    Colonoscopy     HPI: Gregory Clarke is a 28 y.o. male undergoing colonoscopy to assess ulcerative colitis activity/extent.  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  Patient states sulfasalazine  worked, however they caused side effects.  Sulfasalazine  made him feel bad and shaky.  Later was treated with Lialda  in 2022, however he did not take it long.  Doesn't recall why he stopped.  Recently treated with steroids which improved symptoms, but currently having 2-3 loose stools with urgency.  60-80% with blood.  Crampy abdominal pain frequent.   Past Medical History:  Diagnosis Date   Anemia    Anxiety    Asthma    Depression    GERD (gastroesophageal reflux disease)    History of kidney stones    Kidney stones 03/19/2019   Ulcerative colitis [facility])     Past Surgical History:  Procedure Laterality Date   BIOPSY  03/31/2019   Procedure: BIOPSY;  Surgeon: Golda Claudis PENNER, MD;  [facility];  Service: Endoscopy;;   COLONOSCOPY     COLONOSCOPY WITH PROPOFOL  N/A 03/31/2019   Procedure: COLONOSCOPY WITH PROPOFOL ;  Surgeon: Golda Claudis PENNER, MD;  [facility];  Service: Endoscopy;  Laterality: N/A;  7:30   TONSILLECTOMY      Prior to Admission medications   Medication Sig Start Date End Date Taking? Authorizing Provider  albuterol  (VENTOLIN  HFA) 108 (90 Base) MCG/ACT inhaler Inhale 1-2 puffs into the lungs every 6 (six) hours as needed for wheezing or shortness of breath. 12/10/23  Yes Chandra Harlene LABOR, NP  ibuprofen  (ADVIL ) 200 MG tablet Take 800-1,000 mg by mouth every 6 (six) hours as needed for headache or moderate pain.   Yes [provider]  mesalamine  (LIALDA ) 1.2 g EC tablet Take 4 tablets  (4.8 g total) by mouth daily with breakfast. 11/22/23 05/20/24 Yes [place], PA-C  triamcinolone  cream (KENALOG ) 0.1 % Apply 1 Application topically 2 (two) times daily. Do not use more than 14 days in a row 12/10/23  Yes Chandra Harlene LABOR, NP  busPIRone  (BUSPAR ) 10 MG tablet Take 1 tablet (10 mg total) by mouth 3 (three) times daily as needed (panic attacks). 08/23/23   Billy Knee, FNP  methocarbamol  (ROBAXIN ) 500 MG tablet Take 1 tablet (500 mg total) by mouth 2 (two) times daily. 11/13/23   Jerral Meth, MD  sertraline  (ZOLOFT ) 25 MG tablet Take 1 tablet (25 mg total) by mouth daily. Patient not taking: Reported on 11/22/2023 08/23/23   Billy Knee, FNP  Spacer/Aero-Holding Chambers (AEROCHAMBER PLUS) inhaler Use as instructed 04/07/21   Van Knee, MD    Current Outpatient Medications  Medication Sig Dispense Refill   albuterol  (VENTOLIN  HFA) 108 (90 Base) MCG/ACT inhaler Inhale 1-2 puffs into the lungs every 6 (six) hours as needed for wheezing or shortness of breath. 18 g 0   ibuprofen  (ADVIL ) 200 MG tablet Take 800-1,000 mg by mouth every 6 (six) hours as needed for headache or moderate pain.     mesalamine  (LIALDA ) 1.2 g EC tablet Take 4 tablets (4.8 g total) by mouth daily with breakfast. 120 tablet 5   triamcinolone  cream (KENALOG ) 0.1 % Apply 1 Application topically 2 (two) times daily. Do not use more than 14 days in a row  453.6 g 0   busPIRone  (BUSPAR ) 10 MG tablet Take 1 tablet (10 mg total) by mouth 3 (three) times daily as needed (panic attacks). 90 tablet 3   methocarbamol  (ROBAXIN ) 500 MG tablet Take 1 tablet (500 mg total) by mouth 2 (two) times daily. 20 tablet 0   sertraline  (ZOLOFT ) 25 MG tablet Take 1 tablet (25 mg total) by mouth daily. (Patient not taking: Reported on 11/22/2023) 90 tablet 3   Spacer/Aero-Holding Chambers (AEROCHAMBER PLUS) inhaler Use as instructed 1 each 2   Current Facility-Administered Medications  Medication Dose Route Frequency  Provider Last Rate Last Admin   0.9 %  sodium chloride  infusion  500 mL Intravenous Once Stacia Glendia BRAVO, MD        Allergies as of 12/27/2023   (No Known Allergies)    Family History  Problem Relation Age of Onset   Healthy Mother    Healthy Father    Healthy Sister    Healthy Brother    Colon cancer Neg Hx    Esophageal cancer Neg Hx    Rectal cancer Neg Hx    Stomach cancer Neg Hx     Social History   Socioeconomic History   Marital status: Single    Spouse name: Not on file   Number of children: 0   Years of education: Not on file   Highest education level: GED or equivalent  Occupational History   Occupation: umemployed  Tobacco Use   Smoking status: Never   Smokeless tobacco: Never  Vaping Use   Vaping status: Some Days  Substance and Sexual Activity   Alcohol use: No   Drug use: Yes    Types: Marijuana    Comment: last used 12-25-23   Sexual activity: Yes  Other Topics Concern   Not on file  Social History Narrative   Not on file   Social Drivers of Health   Financial Resource Strain: Low Risk  (08/23/2023)   Overall Financial Resource Strain (CARDIA)    Difficulty of Paying Living Expenses: Not very hard  Food Insecurity: No Food Insecurity (08/23/2023)   Hunger Vital Sign    Worried About Running Out of Food in the Last Year: Never true    Ran Out of Food in the Last Year: Never true  Transportation Needs: No Transportation Needs (08/23/2023)   PRAPARE - Administrator, Civil Service (Medical): No    Lack of Transportation (Non-Medical): No  Physical Activity: Sufficiently Active (08/23/2023)   Exercise Vital Sign    Days of Exercise per Week: 6 days    Minutes of Exercise per Session: 150+ min  Stress: Stress Concern Present (08/23/2023)   [employer] of Occupational Health - Occupational Stress Questionnaire    Feeling of Stress: To some extent  Social Connections: Socially Isolated (08/23/2023)   Social Connection and  Isolation Panel    Frequency of Communication with Friends and Family: Once a week    Frequency of Social Gatherings with Friends and Family: Once a week    Attends Religious Services: Never    Database administrator or Organizations: No    Attends Engineer, structural: Not on file    Marital Status: Never married  Intimate Partner Violence: Not on file    Review of Systems:  All other review of systems negative except as mentioned in the HPI.  Physical Exam: Vital signs BP 130/86   Pulse 74   Temp 98.6 F (37 C)  Ht 6' 2 (1.88 m)   Wt 220 lb (99.8 kg)   SpO2 98%   BMI 28.25 kg/m   General:   Alert,  Well-developed, well-nourished, pleasant and cooperative in NAD Airway:  Mallampati 2 Lungs:  Clear throughout to auscultation.   Heart:  Regular rate and rhythm; no murmurs, clicks, rubs,  or gallops. Abdomen:  Soft, nontender and nondistended. Normal bowel sounds.   Neuro/Psych:  Normal mood and affect. A and O x 3   Rodney Wigger E. Stacia, MD [facility]

## 2023-12-27 NOTE — Progress Notes (Unsigned)
 Called to room to assist during endoscopic procedure.  Patient ID and intended procedure confirmed with present staff. Received instructions for my participation in the procedure from the performing physician.

## 2023-12-27 NOTE — Telephone Encounter (Signed)
 Requesting call back in regards to prep and today's procedure. Please advise.

## 2023-12-27 NOTE — Op Note (Signed)
 [facility] Patient Name: Gregory Clarke Procedure Date: 12/27/2023 4:00 PM MRN: [id number] Endoscopist: Glendia E. Stacia , MD, [id number] Age: 28 Referring MD:  Date of Birth: [birth year] Gender: Male Account #: [id number] Procedure:                Colonoscopy Indications:              Disease activity assessment of chronic ulcerative                            pancolitis; previous history of intolerance to                            sulfasalazine , subsequently treated with Lialda ,                            stopped for unclear reasons. Recently completed                            steroid taper. Medicines:                Monitored Anesthesia Care Procedure:                Pre-Anesthesia Assessment:                           - Prior to the procedure, a History and Physical                            was performed, and patient medications and                            allergies were reviewed. The patient's tolerance of                            previous anesthesia was also reviewed. The risks                            and benefits of the procedure and the sedation                            options and risks were discussed with the patient.                            All questions were answered, and informed consent                            was obtained. Prior Anticoagulants: The patient has                            taken no anticoagulant or antiplatelet agents. ASA                            Grade Assessment: II - A patient with mild systemic  disease. After reviewing the risks and benefits,                            the patient was deemed in satisfactory condition to                            undergo the procedure.                           After obtaining informed consent, the colonoscope                            was passed under direct vision. Throughout the                            procedure, the patient's blood pressure,  pulse, and                            oxygen saturations were monitored continuously. The                            CF [id number] #[id number] was introduced through the anus                            and advanced to the the terminal ileum, with                            identification of the appendiceal orifice and IC                            valve. The colonoscopy was performed without                            difficulty. The patient tolerated the procedure                            well. The quality of the bowel preparation was                            adequate. The terminal ileum, ileocecal valve,                            appendiceal orifice, and rectum were photographed.                            The bowel preparation used was SUPREP via split                            dose instruction. Scope In: 4:13:42 PM Scope Out: 4:27:45 PM Scope Withdrawal Time: 0 hours 9 minutes 56 seconds  Total Procedure Duration: 0 hours 14 minutes 3 seconds  Findings:                 The perianal and digital rectal examinations were  normal. Pertinent negatives include normal                            sphincter tone and no palpable rectal lesions.                           Inflammation was found in a continuous and                            circumferential pattern from the rectum to the                            cecum. The cecum was relatively unaffected, with                            only a few scattered areas of focal erythema. This                            was graded as Mayo Score 2 (marked erythema, absent                            vascular pattern, friability). Biopsies were taken                            with a cold forceps for histology. Estimated blood                            loss was minimal.                           The exam was otherwise without abnormality.                           The terminal ileum appeared normal.                           The  retroflexed view of the distal rectum and anal                            verge was normal and showed no anal or rectal                            abnormalities. Complications:            No immediate complications. Estimated Blood Loss:     Estimated blood loss was minimal. Impression:               - Moderately active (Mayo Score 2) pancolitis                            ulcerative colitis. Biopsied.                           - The examination was otherwise normal.                           -  The examined portion of the ileum was normal.                           - The distal rectum and anal verge are normal on                            retroflexion view. Recommendation:           - Patient has a contact number available for                            emergencies. The signs and symptoms of potential                            delayed complications were discussed with the                            patient. Return to normal activities tomorrow.                            Written discharge instructions were provided to the                            patient.                           - Resume previous diet.                           - Continue present medications.                           - Await pathology results.                           - Follow up in the office to discuss maintenance                            medications. Kelliann Pendergraph E. Stacia, MD 12/27/2023 4:36:54 PM This report has been signed electronically.

## 2023-12-30 ENCOUNTER — Telehealth: Payer: Self-pay

## 2023-12-30 NOTE — Telephone Encounter (Signed)
 No answer on follow-up call. Voice mail box full. Unable to leave message for pt.

## 2024-01-01 LAB — SURGICAL PATHOLOGY

## 2024-01-08 ENCOUNTER — Ambulatory Visit: Payer: Self-pay | Admitting: Gastroenterology

## 2024-01-08 NOTE — Progress Notes (Signed)
 Gregory Clarke,  The biopsies of your colon were surprisingly normal, with no evidence of inflammatory bowel disease.  I am going to message the pathologist to double check that this is correct, as your colon clearly looked inflamed. Please plan to follow up with us  in the office as scheduled to discuss possible medication changes for your ulcerative colitis.

## 2024-01-09 ENCOUNTER — Telehealth: Payer: Self-pay | Admitting: Physician Assistant

## 2024-01-09 NOTE — Telephone Encounter (Signed)
 Hello, Gregory Clarke  Pt came in just letting us  know of reoccurring issues (nausea, muscle fatigue, and blood in stool, down about 20 pounds in 2 weeks.   Thanks, sage

## 2024-01-13 NOTE — Telephone Encounter (Signed)
 Attempted to call pt, no answer and unable to leave message, voicemail was full. Will try again.

## 2024-01-14 MED ORDER — PREDNISONE 10 MG PO TABS: ORAL_TABLET | ORAL | 0 refills | Status: AC

## 2024-01-14 NOTE — Telephone Encounter (Signed)
 I have spoken to patient. He is still taking Lialda  4 tablets daily. He is advised that we will restart a prednisone  taper since it appears he is still flaring despite the mesalamine  and previous steroid taper. Patient is in agreement with this and also plans to keep his appointment with T.Garrett, PA-C on 01/24/24. He is asked to reach out sooner if he has worsening symptoms despite the addition of prednisone  back to his medications. Additionally, patient is advised we would like him to see Dr Stacia in follow up in 3 months. Unfortunately, we do not yet have a schedule out for that time frame so patient is asked to call to schedule this at a later time.

## 2024-01-17 ENCOUNTER — Telehealth: Payer: Self-pay | Admitting: Physician Assistant

## 2024-01-17 NOTE — Telephone Encounter (Signed)
 I received FMLA form to complete for patient.  He is requesting to be out of work now due to flareup of ulcerative colitis.  Return to work date December 1 or until better.  He is having active ulcerative colitis with rectal bleeding, fatigue, nausea, lightheadedness, abdominal cramping, diarrhea, weight loss.  Recently restarted back on oral prednisone .  Also takes Lialda  4.8 g daily.  He works for [employer].  I will work on his forms. Ellouise Console, PA-C

## 2024-01-23 NOTE — Progress Notes (Signed)
 Ellouise Console, PA-C [address] [place], [place]  [address] Phone: [phone]   Primary Care Physician: Billy Knee, FNP  Primary Gastroenterologist:  Ellouise Console, PA-C / Glendia Holt, MD   Chief Complaint: Follow-up ulcerative colitis       HPI:   Discussed the use of AI scribe software for clinical note transcription with the patient, who gave verbal consent to proceed.  28 year old male returns for 59-month follow-up of ulcerative colitis.  I last saw him 11/22/2023 to establish GI care.  Transferred to our office from [facility] gastroenterology.  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  Patient states sulfasalazine  worked, however it caused side effects.  Sulfasalazine  made him feel bad and shaky.  Later was treated with Lialda  in 2022, however he did not take it long.  Lost to follow-up after that.    Had a flareup of rectal bleeding, diarrhea, lower abdominal cramping which prompted ED visit 11/12/2023.  Was started on prednisone  taper starting at 60 mg daily.  Symptoms improved on prednisone .  He was restarted on Lialda  4.8 g daily.  When he weaned off prednisone  he had another flareup of symptoms and was started back on oral prednisone  taper 40 mg daily starting 01/14/2024..  Lialda  was not controlling his symptoms.  Prednisone  has helped he continues to have GI symptoms despite treatment.  12/27/2023 colonoscopy by Dr. Holt: Moderately active (Mayo score 2) pan ulcerative colitis.  Normal ileum.  Biopsies showed benign colon mucosa.  No dysplasia.  Repeat colonoscopy in 8 years.  12/20/2023: Elevated fecal calprotectin 1080.    11/22/2023:  Negative QuantiFERON TB Gold test.  Normal CBC with WBC 10.3, Hgb 13.9.  CRP less than 1.  Normal CMP.  C. difficile and GI pathogen panel negative for all infections.  History of Present Illness He experiences frequent diarrhea, blood in his stool, and lower abdominal cramping. Since resuming  prednisone  about a week and a half ago, his stools have become more solid and less frequent, but he still experiences urgency, cramping, and blood in his stool. He has also been experiencing nausea and vomited about a week ago, which coincided with the start of prednisone . No more vomiting at present.  He has been keeping a food diary to identify dietary triggers.  He has been struggling with work due to the need for frequent bathroom visits and has been trying to obtain disability.  He is requesting to be out of work until December 1 or until we can get his symptoms under control with treatment.  He provides FMLA and disability forms for me to complete.  He also reports muscle fatigue, feeling 'very, very tired,' and difficulty staying awake. He experiences joint pain, particularly in his knees, ankles, and fingers. He reports a family history of arthritis.  He has had skin issues, including rashes in the folds of his skin, which have improved with prednisone . However, the backs of his legs were previously so raw that it was painful to walk. He also mentions a history of kidney stones, with a recent episode about a month ago, and is unsure if he is currently forming another stone or if his symptoms are due to muscle tightness from abdominal cramping.   11/13/2023 abdominal pelvis with contrast: 1. Circumferential wall thickening of the descending colon worrisome for colitis. 2. Nonobstructing bilateral renal calculi.   11/15/2023: Negative C. difficile stool test.  Negative GI pathogen panel.  WBC 11.9, hemoglobin 14.9, platelet 336.  Normal  CMP.   03/2019 colonoscopy: Moderately active Mayo score 2 ulcerative colitis to the hepatic flexure, new diagnosis.  Ascending colon, cecum, and ileum were normal.  Current Outpatient Medications  Medication Sig Dispense Refill   albuterol  (VENTOLIN  HFA) 108 (90 Base) MCG/ACT inhaler Inhale 1-2 puffs into the lungs every 6 (six) hours as needed for wheezing or  shortness of breath. 18 g 0   busPIRone  (BUSPAR ) 10 MG tablet Take 1 tablet (10 mg total) by mouth 3 (three) times daily as needed (panic attacks). 90 tablet 3   dicyclomine  (BENTYL ) 10 MG capsule Take 1 capsule (10 mg total) by mouth 3 (three) times daily before meals. 90 capsule 2   ibuprofen  (ADVIL ) 200 MG tablet Take 800-1,000 mg by mouth every 6 (six) hours as needed for headache or moderate pain.     mesalamine  (LIALDA ) 1.2 g EC tablet Take 4 tablets (4.8 g total) by mouth daily with breakfast. 120 tablet 5   methocarbamol  (ROBAXIN ) 500 MG tablet Take 1 tablet (500 mg total) by mouth 2 (two) times daily. (Patient taking differently: Take 500 mg by mouth as needed.) 20 tablet 0   ondansetron  (ZOFRAN ) 4 MG tablet Take 1 tablet (4 mg total) by mouth every 8 (eight) hours as needed for nausea or vomiting. 60 tablet 1   predniSONE  (DELTASONE ) 10 MG tablet Take 4 tablets (40 mg total) by mouth daily with breakfast for 14 days, THEN 3 tablets (30 mg total) daily with breakfast for 14 days, THEN 2 tablets (20 mg total) daily with breakfast for 14 days, THEN 1 tablet (10 mg total) daily with breakfast for 14 days. 140 tablet 0   Spacer/Aero-Holding Chambers (AEROCHAMBER PLUS) inhaler Use as instructed 1 each 2   triamcinolone  cream (KENALOG ) 0.1 % Apply 1 Application topically 2 (two) times daily. Do not use more than 14 days in a row 453.6 g 0   sertraline  (ZOLOFT ) 25 MG tablet Take 1 tablet (25 mg total) by mouth daily. (Patient not taking: Reported on 01/24/2024) 90 tablet 3   No current facility-administered medications for this visit.    Allergies as of 01/24/2024   (No Known Allergies)    Past Medical History:  Diagnosis Date   Anemia    Anxiety    Asthma    Depression    GERD (gastroesophageal reflux disease)    History of kidney stones    Kidney stones 03/19/2019   Ulcerative colitis [facility])     Past Surgical History:  Procedure Laterality Date   BIOPSY  03/31/2019   Procedure:  BIOPSY;  Surgeon: Golda Claudis PENNER, MD;  Location: [facility];  Service: Endoscopy;;   COLONOSCOPY     COLONOSCOPY WITH PROPOFOL  N/A 03/31/2019   Procedure: COLONOSCOPY WITH PROPOFOL ;  Surgeon: Golda Claudis PENNER, MD;  Location: [facility];  Service: Endoscopy;  Laterality: N/A;  7:30   TONSILLECTOMY      Review of Systems:    All systems reviewed and negative except where noted in HPI.    Physical Exam:  BP 114/76   Pulse (!) 105   Ht 6' 2 (1.88 m)   Wt 220 lb 12.8 oz (100.2 kg)   BMI 28.35 kg/m  No LMP for male patient.  General: Well-nourished, well-developed in no acute distress.  Lungs: Clear to auscultation bilaterally. Non-labored. Heart: Regular rate and rhythm, no murmurs rubs or gallops.  Abdomen: Bowel sounds are normal; Abdomen is Soft; No hepatosplenomegaly, masses or hernias; mild generalized upper and lower  abdominal Tenderness; No guarding or rebound tenderness. Neuro: Alert and oriented x 3.  Grossly intact.  Psych: Alert and cooperative, normal mood and affect. Skin: Eczematous rash in the flexural folds including the elbows.   Imaging Studies: No results found.  Labs: CBC    Component Value Date/Time   WBC 13.3 (H) 01/24/2024 1209   RBC 4.25 01/24/2024 1209   HGB 13.4 01/24/2024 1209   HGB 15.1 02/02/2023 1425   HCT 38.9 (L) 01/24/2024 1209   HCT 43.4 02/02/2023 1425   PLT 435.0 (H) 01/24/2024 1209   PLT 333 02/02/2023 1425   MCV 91.7 01/24/2024 1209   MCV 93 02/02/2023 1425   MCH 32.3 11/12/2023 2246   MCHC 34.5 01/24/2024 1209   RDW 12.8 01/24/2024 1209   RDW 13.1 02/02/2023 1425   LYMPHSABS 1.2 01/24/2024 1209   LYMPHSABS 2.0 02/02/2023 1425   MONOABS 1.0 01/24/2024 1209   EOSABS 0.0 01/24/2024 1209   EOSABS 0.6 (H) 02/02/2023 1425   BASOSABS 0.0 01/24/2024 1209   BASOSABS 0.1 02/02/2023 1425    CMP     Component Value Date/Time   NA 137 01/24/2024 1209   NA 138 02/02/2023 1425   K 4.2 01/24/2024 1209   CL 104 01/24/2024 1209    CO2 26 01/24/2024 1209   GLUCOSE 93 01/24/2024 1209   BUN 11 01/24/2024 1209   BUN 11 02/02/2023 1425   CREATININE 0.78 01/24/2024 1209   CALCIUM 8.9 01/24/2024 1209   PROT 7.4 01/24/2024 1209   PROT 7.1 02/02/2023 1425   ALBUMIN 4.2 01/24/2024 1209   ALBUMIN 4.7 02/02/2023 1425   AST 23 01/24/2024 1209   ALT 41 01/24/2024 1209   ALKPHOS 85 01/24/2024 1209   BILITOT 0.3 01/24/2024 1209   BILITOT 0.2 02/02/2023 1425   GFRNONAA >60 11/12/2023 2246   GFRAA >60 08/29/2019 1637       Assessment and Plan:   Gregory Clarke is a 28 y.o. y/o male presents for follow-up of:  1.  Moderate pan ulcerative colitis: Not controlled on Lialda  4.8 g daily.  Responded to oral prednisone , but had a flare when tapered off prednisone .  Sulfasalazine  worked, however discontinued due to adverse side effects. - Check Labs: HBsAg, anti-HBs, and core antibody ; Hep C -I discussed patient's case with Dr. Stacia today.  He recommends starting patient on Entyvio or Remicade.   - Lengthy discussion with patient regarding treatment options.  We discussed adverse side effects of Entyvio, to include but not limited to low risk of  injection site reactions, nasopharyngitis, headache, joint pain, nausea, fever, URI, fatigue.  - Discussed risks of Remicade to include infections, lymphoma, skin cancer, hepatic carcinoma, and possibly other malignancies.   - We will work on getting medication approved with insurance.  Pending insurance approval, plan to start Entyvio.  If not covered, then we will try Remicade. --Vendolizumab (Entyvio) Induction: 300mg  IV week 0, week 2, week 6 Maintenance  300mg  IV infusion every 8 weeks.  - Advised patient about IBD health maintenance.  Annual skin exam and eye exam.  Ensure all immunizations are up-to-date through PCP including hepatitis A and B, influenza, Prevnar, Pneumovax, varicella, and zoster. -Continue Lialda  4.8 g daily until we can get him on a different  medication. - Continue current prednisone  taper.  Ellouise Console, PA-C  Follow up with Dr. Stacia in 2 months (03/26/2024).

## 2024-01-24 ENCOUNTER — Encounter: Payer: Self-pay | Admitting: Physician Assistant

## 2024-01-24 ENCOUNTER — Telehealth: Payer: Self-pay | Admitting: Physician Assistant

## 2024-01-24 ENCOUNTER — Other Ambulatory Visit (INDEPENDENT_AMBULATORY_CARE_PROVIDER_SITE_OTHER)

## 2024-01-24 ENCOUNTER — Ambulatory Visit: Admitting: Physician Assistant

## 2024-01-24 VITALS — BP 114/76 | HR 105 | Ht 74.0 in | Wt 220.8 lb

## 2024-01-24 DIAGNOSIS — K51011 Ulcerative (chronic) pancolitis with rectal bleeding: Secondary | ICD-10-CM | POA: Diagnosis not present

## 2024-01-24 DIAGNOSIS — R110 Nausea: Secondary | ICD-10-CM

## 2024-01-24 DIAGNOSIS — R197 Diarrhea, unspecified: Secondary | ICD-10-CM

## 2024-01-24 DIAGNOSIS — K625 Hemorrhage of anus and rectum: Secondary | ICD-10-CM

## 2024-01-24 DIAGNOSIS — R109 Unspecified abdominal pain: Secondary | ICD-10-CM

## 2024-01-24 LAB — CBC WITH DIFFERENTIAL/PLATELET
Basophils Absolute: 0 K/uL (ref 0.0–0.1)
Basophils Relative: 0.2 % (ref 0.0–3.0)
Eosinophils Absolute: 0 K/uL (ref 0.0–0.7)
Eosinophils Relative: 0.2 % (ref 0.0–5.0)
HCT: 38.9 % — ABNORMAL LOW (ref 39.0–52.0)
Hemoglobin: 13.4 g/dL (ref 13.0–17.0)
Lymphocytes Relative: 9.1 % — ABNORMAL LOW (ref 12.0–46.0)
Lymphs Abs: 1.2 K/uL (ref 0.7–4.0)
MCHC: 34.5 g/dL (ref 30.0–36.0)
MCV: 91.7 fl (ref 78.0–100.0)
Monocytes Absolute: 1 K/uL (ref 0.1–1.0)
Monocytes Relative: 7.8 % (ref 3.0–12.0)
Neutro Abs: 11 K/uL — ABNORMAL HIGH (ref 1.4–7.7)
Neutrophils Relative %: 82.7 % — ABNORMAL HIGH (ref 43.0–77.0)
Platelets: 435 K/uL — ABNORMAL HIGH (ref 150.0–400.0)
RBC: 4.25 Mil/uL (ref 4.22–5.81)
RDW: 12.8 % (ref 11.5–15.5)
WBC: 13.3 K/uL — ABNORMAL HIGH (ref 4.0–10.5)

## 2024-01-24 LAB — COMPREHENSIVE METABOLIC PANEL WITH GFR
ALT: 41 U/L (ref 0–53)
AST: 23 U/L (ref 0–37)
Albumin: 4.2 g/dL (ref 3.5–5.2)
Alkaline Phosphatase: 85 U/L (ref 39–117)
BUN: 11 mg/dL (ref 6–23)
CO2: 26 meq/L (ref 19–32)
Calcium: 8.9 mg/dL (ref 8.4–10.5)
Chloride: 104 meq/L (ref 96–112)
Creatinine, Ser: 0.78 mg/dL (ref 0.40–1.50)
GFR: 123.1 mL/min (ref >60.00)
Glucose, Bld: 93 mg/dL (ref 70–99)
Potassium: 4.2 meq/L (ref 3.5–5.1)
Sodium: 137 meq/L (ref 135–145)
Total Bilirubin: 0.3 mg/dL (ref 0.2–1.2)
Total Protein: 7.4 g/dL (ref 6.0–8.3)

## 2024-01-24 MED ORDER — ONDANSETRON HCL 4 MG PO TABS: 4.0000 mg | ORAL_TABLET | Freq: Three times a day (TID) | ORAL | 1 refills | Status: AC | PRN

## 2024-01-24 MED ORDER — DICYCLOMINE HCL 10 MG PO CAPS: 10.0000 mg | ORAL_CAPSULE | Freq: Three times a day (TID) | ORAL | 2 refills | Status: AC

## 2024-01-24 NOTE — Telephone Encounter (Signed)
 Please let us  know if

## 2024-01-24 NOTE — Patient Instructions (Addendum)
 We will have our nurses work on getting you approved and started on Entyvio or Remicade.  Vendolizumab (Entyvio) Induction: 300mg  IV week 0, week 2, week 6 Maintenance  IV infusion every 8 weeks.  Possible Side Effects: injection site reactions, nasopharyngitis, headache, joint pain, nausea, fever, URI, fatigue.  Allergic reaction, Infections Monitor Liver Labs  If we cannot get Entyvio approved with insurance, then we will try Remicade:  Infliximab (Remicade) 5-10 mg/kg/dose  Induction: IV at weeks 0, 2, and 6 Maintenance: IV infusion every 8 weeks  Possible Side Effects: infusion reactions, autoimmune responses, increased risk of infection ( 26/1000 versus 35/1000), lymphoma (2/10000 versus 6/10000) , skin cancer, hepatic carcinoma, and possibly other malignancies.  Rare AEs: include hepatotoxicity, development or exacerbation of multiple sclerosis or optic neuritis, lupus-like reaction, and worsening of congestive heart failure in patients with preexisting cardiac disease.  Health maintenance: - Recommend annual skin exam to screen for skin cancer -Follow-up with PCP to make sure all immunizations are up-to-date including: hepatitis A and B, influenza, Prevnar, Pneumovax, varicella, and zoster.  Your provider has requested that you go to the basement level for lab work before leaving today. Press B on the elevator. The lab is located at the first door on the left as you exit the elevator.   We have sent the following medications to your pharmacy for you to pick up at your convenience: Zofran  and Bentyl 

## 2024-01-24 NOTE — Telephone Encounter (Signed)
 Please see note from Ellouise Console PA asking if Remicade or entyvio would be covered by pts insurance.

## 2024-01-25 ENCOUNTER — Encounter: Payer: Self-pay | Admitting: Physician Assistant

## 2024-01-25 LAB — HEPATITIS A ANTIBODY, TOTAL: Hepatitis A AB,Total: REACTIVE — AB

## 2024-01-25 LAB — HEPATITIS B CORE ANTIBODY, TOTAL: Hep B Core Total Ab: NONREACTIVE

## 2024-01-25 LAB — HEPATITIS A ANTIBODY, IGM: Hep A IgM: NONREACTIVE

## 2024-01-25 LAB — HEPATITIS C ANTIBODY: Hepatitis C Ab: NONREACTIVE

## 2024-01-25 LAB — HEPATITIS B SURFACE ANTIBODY,QUALITATIVE: Hep B S Ab: REACTIVE — AB

## 2024-01-25 LAB — HEPATITIS B SURFACE ANTIGEN: Hepatitis B Surface Ag: NONREACTIVE

## 2024-01-27 ENCOUNTER — Ambulatory Visit: Payer: Self-pay | Admitting: Physician Assistant

## 2024-01-27 ENCOUNTER — Other Ambulatory Visit: Payer: Self-pay | Admitting: Physician Assistant

## 2024-01-27 DIAGNOSIS — K51011 Ulcerative (chronic) pancolitis with rectal bleeding: Secondary | ICD-10-CM

## 2024-01-27 NOTE — Progress Notes (Signed)
 Sent in order for Entyvio to [facility] [address] For UC. Ellouise Console, PA-C

## 2024-01-27 NOTE — Telephone Encounter (Signed)
 Sending to infusion team to see if they can look into it

## 2024-01-27 NOTE — Telephone Encounter (Signed)
 Ellouise IVER Handing, I have submitted the auth for Entyvio and it has been approved.  Please enter the treatment plan and patient will be scheduled as soon as possible.  Auth Submission: APPROVED Site of care: Site of care: [facility] Payer: [employer] Medication & CPT/J Code(s) submitted: Entyvio (Vedolizumab) [id number] Diagnosis Code:  Route of submission (phone, fax, portal): PORTAL Phone # Fax # Auth type: Buy/Bill PB Units/visits requested: 300MG  DAY0, DAY14, DAY42, THEN Q8WKS Reference number: [id number] Approval from: 01/27/24 to 01/26/25    Co-pay Card: [employer] aware

## 2024-01-28 NOTE — Telephone Encounter (Signed)
 Noted

## 2024-02-06 NOTE — Progress Notes (Signed)
 Agree with the assessment and plan as outlined by Brigitte Canard, PA-C.

## 2024-02-12 ENCOUNTER — Ambulatory Visit (INDEPENDENT_AMBULATORY_CARE_PROVIDER_SITE_OTHER)

## 2024-02-12 VITALS — BP 124/77 | HR 100 | Temp 98.2°F | Resp 18 | Ht 74.0 in | Wt 226.2 lb

## 2024-02-12 DIAGNOSIS — K51011 Ulcerative (chronic) pancolitis with rectal bleeding: Secondary | ICD-10-CM

## 2024-02-12 MED ORDER — VEDOLIZUMAB 300 MG IV SOLR
300.0000 mg | Freq: Once | INTRAVENOUS | Status: AC
  Administered 2024-02-12: 300 mg via INTRAVENOUS
  Filled 2024-02-12: qty 5

## 2024-02-12 NOTE — Progress Notes (Signed)
 Diagnosis: Ulcerative Colitis  Provider:  Praveen Mannam MD  Procedure: IV Infusion  IV Type: Peripheral, IV Location: R Forearm  Entyvio (Vedolizumab), Dose: 300 mg  Infusion Start Time: 0831  Infusion Stop Time: 0905  Post Infusion IV Care: Observation period completed and Peripheral IV Discontinued  Discharge: Condition: Good, Destination: Home . AVS Provided  Performed by:  Donny Childes, RN

## 2024-02-12 NOTE — Patient Instructions (Signed)
 Vedolizumab Injection What is this medication? VEDOLIZUMAB (ve doe LIZ you mab) treats inflammatory bowel diseases. It works by decreasing inflammation in the digestive tract. It is a monoclonal antibody. This medicine may be used for other purposes; ask your health care provider or pharmacist if you have questions. COMMON BRAND NAME(S): Entyvio What should I tell my care team before I take this medication? They need to know if you have any of these conditions: Immune system problems Infection, such a tuberculosis (TB) or other bacterial, fungal, or viral infections Liver disease Recent or upcoming vaccine An unusual or allergic reaction to vedolizumab, other medications, foods, dyes, or preservatives Pregnant or trying to get pregnant Breastfeeding How should I use this medication? This medication is injected into a vein or under the skin. It is given by your care team in a hospital or clinic setting if it is injected into a vein. If it is injected under the skin, it may be given at home. If you get this medication at home, you will be taught how to prepare and give it. Use exactly as directed. Take it as directed on the prescription label. Keep taking it unless your care team tells you to stop. A special MedGuide will be given to you by the pharmacist with each prescription and refill. Be sure to read this information carefully each time. This medication comes with INSTRUCTIONS FOR USE. Ask your pharmacist for directions on how to use this medication. Read the information carefully. Talk to your pharmacist or care team if you have questions. Talk to your care team about the use of this medication in children. Special care may be needed. Overdosage: If you think you have taken too much of this medicine contact a poison control center or emergency room at once. NOTE: This medicine is only for you. Do not share this medicine with others. What if I miss a dose? If you get this medication at the  hospital or clinic: It is important not to miss your dose. Call your care team if you are unable to keep an appointment. If you give yourself this medication at home: If you miss a dose, take it as soon as you can. Then resume dosing every 2 weeks. Do not take double or extra doses. Call your care team with questions. What may interact with this medication? Live virus vaccines Natalizumab TNF blockers, such as adalimumab or infliximab This medication may affect how other medications work. Talk with your care team about all of the medications you take. They may suggest changes to your treatment plan to lower the risk of side effects and to make sure your medications work as intended. This list may not describe all possible interactions. Give your health care provider a list of all the medicines, herbs, non-prescription drugs, or dietary supplements you use. Also tell them if you smoke, drink alcohol, or use illegal drugs. Some items may interact with your medicine. What should I watch for while using this medication? Visit your care team for regular checks on your progress. Tell your care team if your symptoms do not start to get better or if they get worse. This medication may increase your risk of getting an infection. Call your care team for advice if you get a fever, chills, sore throat, or other symptoms of a cold or flu. Do not treat yourself. Try to avoid being around people who are sick. In some patients, this medication may cause a serious brain infection that may cause death. If  you have any problems seeing, thinking, speaking, walking, or standing, tell your care team right away. If you cannot reach your care team, urgently seek other source of medical care. What side effects may I notice from receiving this medication? Side effects that you should report to your care team as soon as possible: Allergic reactions--skin rash, itching, hives, swelling of the face, lips, tongue, or  throat Dizziness, loss of balance or coordination, confusion or trouble speaking Infection--fever, chills, cough, sore throat, wounds that don't heal, pain or trouble when passing urine, general feeling of discomfort or being unwell Infusion reactions--chest pain, shortness of breath or trouble breathing, feeling faint or lightheaded Liver injury--right upper belly pain, loss of appetite, nausea, light-colored stool, dark yellow or brown urine, yellowing skin or eyes, unusual weakness or fatigue Side effects that usually do not require medical attention (report these to your care team if they continue or are bothersome): Headache Fatigue Joint pain Nausea This list may not describe all possible side effects. Call your doctor for medical advice about side effects. You may report side effects to FDA at 1-800-FDA-1088. Where should I keep my medication? Keep out of the reach of children and pets. Store in a refrigerator or at room temperature between 20 and 25 degrees C (68 and 77 degrees F). Refrigeration (preferred): Store in the refrigerator. Do not freeze. Keep unopened prefilled syringes or pens in the original packaging to protect from light. Get rid of any unused medication after the expiration date. Room temperature: This medication may be stored at room temperature for up to 7 days. Keep unopened prefilled syringes or pens in the original packaging to protect from light. Get rid of any unused medication after 7 days, or after it expires, whichever is first. To get rid of medications that are no longer needed or have expired: Take the medication to a medication take-back program. Check with your pharmacy or law enforcement to find a location. If you cannot return the medication, ask your pharmacist or care team how to get rid of this medication safely. NOTE: This sheet is a summary. It may not cover all possible information. If you have questions about this medicine, talk to your doctor,  pharmacist, or health care provider.  2024 Elsevier/Gold Standard (2023-02-08 00:00:00)

## 2024-02-13 ENCOUNTER — Ambulatory Visit: Admitting: Physician Assistant

## 2024-02-21 ENCOUNTER — Encounter: Admitting: Internal Medicine

## 2024-02-24 ENCOUNTER — Telehealth: Payer: Self-pay | Admitting: Physician Assistant

## 2024-02-24 NOTE — Telephone Encounter (Signed)
 Inbound call from patient stating he is on disability and would like to know if our office can fax over an extension for him since he was recently switched to a new treatment and the second dosage is this week and he does not know how he would feel. Extension due by 03/02/24 Fax number [phone]  Please advise  Thank you

## 2024-02-24 NOTE — Telephone Encounter (Signed)
 Ellouise-  Please advise. I will have to get patient to send over appropriate paperwork from his company if you are willing to fill out an extension.

## 2024-02-25 NOTE — Telephone Encounter (Signed)
 02/12/24 was patient's first infusion. Second Entyvio  infusion set for 02/26/24.  Patient indicates that from a GI standpoint, he does feel better with more consistent, normal stools, though does still have some lower abdominal pain/discomfort with occasional blood in the stool and loose stools.  He states that he feels the majority of his symptoms are more related to his prednisone  taper. He is currently on prednisone  20 mg daily (has been x 1 week) and has an additional week at this dosage until further reducing to 10 mg. He is shaky has difficulty sleeping, increased fatigue, leg cramps and muscle fatigue as well.  Is currently taking Lialda  4.8 grams daily but says he was not sure if he is to discontinue this medicine immediately following initiation of Entyvio  dose 1 or if he is supposed to d/c after complete set of entvyio induction infusions. Please advise.

## 2024-02-26 ENCOUNTER — Ambulatory Visit

## 2024-02-28 ENCOUNTER — Ambulatory Visit

## 2024-02-28 VITALS — BP 121/77 | HR 79 | Temp 97.7°F | Resp 18 | Ht 74.0 in | Wt 222.8 lb

## 2024-02-28 DIAGNOSIS — K51011 Ulcerative (chronic) pancolitis with rectal bleeding: Secondary | ICD-10-CM

## 2024-02-28 MED ORDER — VEDOLIZUMAB 300 MG IV SOLR
300.0000 mg | Freq: Once | INTRAVENOUS | Status: AC
  Administered 2024-02-28: 300 mg via INTRAVENOUS
  Filled 2024-02-28: qty 5

## 2024-02-28 MED ORDER — SODIUM CHLORIDE 0.9 % IV SOLN: INTRAVENOUS | Status: DC

## 2024-02-28 NOTE — Progress Notes (Signed)
 Diagnosis: Ulcerative Colitis  Provider:  Lonna Coder MD  Procedure: IV Infusion  IV Type: Peripheral, IV Location: L Antecubital  Entyvio  (Vedolizumab ), Dose: 300 mg  Infusion Start Time: 1504  Infusion Stop Time: 1536  Post Infusion IV Care: Peripheral IV Discontinued  Discharge: Condition: Good, Destination: Home . AVS Provided  Performed by:  Klee Kolek, RN

## 2024-02-28 NOTE — Telephone Encounter (Signed)
 Note routed to Gregory Clarke at [phone] to extend his leave until he sees the MD again.

## 2024-02-28 NOTE — Telephone Encounter (Addendum)
 Patient is requesting a call to discuss previous note with questions. Also requesting to speak regarding work note to disability states information is due on Monday. Please advise, thank you

## 2024-03-03 ENCOUNTER — Other Ambulatory Visit: Payer: Self-pay | Admitting: Family

## 2024-03-03 DIAGNOSIS — J4521 Mild intermittent asthma with (acute) exacerbation: Secondary | ICD-10-CM

## 2024-03-10 ENCOUNTER — Telehealth: Payer: Self-pay | Admitting: Physician Assistant

## 2024-03-10 NOTE — Telephone Encounter (Signed)
 Received fax from [employer] Re: short term disability. Ellouise Console, PA-C

## 2024-03-20 ENCOUNTER — Other Ambulatory Visit: Payer: Self-pay | Admitting: Internal Medicine

## 2024-03-20 DIAGNOSIS — J4521 Mild intermittent asthma with (acute) exacerbation: Secondary | ICD-10-CM

## 2024-03-20 MED ORDER — ALBUTEROL SULFATE HFA 108 (90 BASE) MCG/ACT IN AERS: 1.0000 | INHALATION_SPRAY | Freq: Four times a day (QID) | RESPIRATORY_TRACT | 1 refills | Status: AC | PRN

## 2024-03-20 NOTE — Telephone Encounter (Signed)
 Requesting: albuterol  (VENTOLIN  HFA) 108 (90 Base) MCG/ACT inhaler  Last Visit: 08/23/2023 Next Visit: 04/14/2024 Last Refill: 12/10/2023  Please Advise

## 2024-03-20 NOTE — Telephone Encounter (Signed)
 Copied from CRM #[id number]. Topic: Clinical - Medication Refill >> Mar 20, 2024 10:54 AM Berneda FALCON wrote: Medication: albuterol  (VENTOLIN  HFA) 108 (90 Base) MCG/ACT inhaler  Has the patient contacted their pharmacy? Yes (Agent: If no, request that the patient contact the pharmacy for the refill. If patient does not wish to contact the pharmacy document the reason why and proceed with request.) (Agent: If yes, when and what did the pharmacy advise?)  This is the patient's preferred pharmacy:  [facility] [phone] - [place], [place] - [address] AT [facility] OF [address] & [address]  [place] [address] Phone: [phone] Fax: [phone]  Is this the correct pharmacy for this prescription? Yes If no, delete pharmacy and type the correct one.   Has the prescription been filled recently? No  Is the patient out of the medication? No  Has the patient been seen for an appointment in the last year OR does the patient have an upcoming appointment? Yes  Can we respond through MyChart? Yes  Agent: Please be advised that Rx refills may take up to 3 business days. We ask that you follow-up with your pharmacy.

## 2024-03-26 ENCOUNTER — Ambulatory Visit: Admitting: Gastroenterology

## 2024-03-26 ENCOUNTER — Encounter: Payer: Self-pay | Admitting: Gastroenterology

## 2024-03-26 VITALS — BP 128/82 | HR 82 | Ht 74.0 in | Wt 226.0 lb

## 2024-03-26 DIAGNOSIS — Z23 Encounter for immunization: Secondary | ICD-10-CM | POA: Diagnosis not present

## 2024-03-26 DIAGNOSIS — K5100 Ulcerative (chronic) pancolitis without complications: Secondary | ICD-10-CM

## 2024-03-26 NOTE — Progress Notes (Unsigned)
 "  Discussed the use of AI scribe software for clinical note transcription with the patient, who gave verbal consent to proceed.  HPI : Gregory Clarke is a 29 year old male with ulcerative colitis and asthma who presents for follow-up of ulcerative colitis symptoms after recent prednisone  taper and ongoing Entyvio  infusions.  Currently receiving Entyvio  infusions for ulcerative colitis, with the third loading dose scheduled for tomorrow. Entyvio  has been well tolerated without abnormal reactions.  Completed a prednisone  taper 1.5 to 2 weeks ago, ending at 10 mg. While on prednisone  and Entyvio , stools were normal. In the past week since stopping prednisone , he has experienced increased diarrhea, occasional blood in the stool (30-40% of bowel movements, mostly seen on wiping), and increased abdominal cramping. Stools have appeared darker since stopping prednisone , but no major bleeding. Bowel movement frequency is currently 1-2 times per day, decreased from over 4 times daily prior to steroids. Increased urgency has also been noted since stopping prednisone .  History of multiple courses of prednisone  for asthma, eczema/skin rashes, and ulcerative colitis, with intermittent steroid use since diagnosis and colonoscopy in 2021.  Previously used Lialda  for ulcerative colitis but discontinued midway between the first and second Entyvio  doses. Lialda  remains available at home.  Since stopping prednisone , increased inhaler use and more active asthma symptoms have occurred, including feeling winded after minimal exertion. Reports overall fatigue, low energy, and muscle fatigue, with recent muscle spasms in the legs, which he attributes to prednisone  withdrawal based on prior experience.  Mild cold symptoms (congestion, runny nose) have been present over the past couple of weeks, consistent with his usual seasonal symptoms.  Has been out on disability since late November and is scheduled to return to work  on Monday. Continues to experience some weakness and fatigue related to prednisone  withdrawal but feels ready to return to work.     12/27/2023 colonoscopy by Dr. Stacia: Moderately active (Mayo score 2) pan ulcerative colitis.  Normal ileum.  Biopsies showed benign colon mucosa.  No dysplasia.  Repeat colonoscopy in 8 years.   12/20/2023: Elevated fecal calprotectin 1080.     11/22/2023:  Negative QuantiFERON TB Gold test.  Normal CBC with WBC 10.3, Hgb 13.9.  CRP less than 1.  Normal CMP.  C. difficile and GI pathogen panel negative for all infections.  CT Abdomen/Pelvis 11/13/2023 IMPRESSION: 1. Circumferential wall thickening of the descending colon worrisome for colitis. 2. Nonobstructing bilateral renal calculi   CT Abdomen/pelvis 07/06/2021 IMPRESSION: 1. Largely normal CT appearance of the abdomen and pelvis. No active inflammatory bowel disease identified. 2. Nephrolithiasis, better demonstrated on the noncontrast exam last year, with no obstructive uropathy.   CT Abdomen/pelvis 08/29/2019 IMPRESSION: Findings consistent with an infectious or inflammatory enteritis involving short segment of proximal to mid ileum   Colonoscopy Jan 2021 (Dr. Golda) Inflammation was found in a continuous and circumferential pattern from the rectum to the hepatic flexure. This was graded as Mayo Score 2 (moderate, with marked erythema, absent vascular pattern, friability, erosions), and when compared to the previous examination, the findings are new. Stool specimen taken for GI pathogen panel panel. No additional abnormalities were found on retroflexion.  - The examined portion of the ileum was normal.  - The ascending colon and cecum are normal. Biopsied.  - Moderately active (Mayo Score 2) pancolitis ulcerative colitis, new diagnosis.  Needs PCV21 or PCV20, Shingrix, influenza, COVID  Past Medical History:  Diagnosis Date   Anemia    Anxiety    Asthma  Depression    GERD  (gastroesophageal reflux disease)    History of kidney stones    Kidney stones 03/19/2019   Ulcerative colitis [facility])      Past Surgical History:  Procedure Laterality Date   BIOPSY  03/31/2019   Procedure: BIOPSY;  Surgeon: Golda Claudis PENNER, MD;  Location: [facility];  Service: Endoscopy;;   COLONOSCOPY     COLONOSCOPY WITH PROPOFOL  N/A 03/31/2019   Procedure: COLONOSCOPY WITH PROPOFOL ;  Surgeon: Golda Claudis PENNER, MD;  Location: [facility];  Service: Endoscopy;  Laterality: N/A;  7:30   TONSILLECTOMY     Family History  Problem Relation Age of Onset   Healthy Mother    Healthy Father    Healthy Sister    Healthy Brother    Colon cancer Neg Hx    Esophageal cancer Neg Hx    Rectal cancer Neg Hx    Stomach cancer Neg Hx    Social History[1] Current Outpatient Medications  Medication Sig Dispense Refill   albuterol  (VENTOLIN  HFA) 108 (90 Base) MCG/ACT inhaler Inhale 1-2 puffs into the lungs every 6 (six) hours as needed for wheezing or shortness of breath. 18 g 1   busPIRone  (BUSPAR ) 10 MG tablet Take 1 tablet (10 mg total) by mouth 3 (three) times daily as needed (panic attacks). 90 tablet 3   dicyclomine  (BENTYL ) 10 MG capsule Take 1 capsule (10 mg total) by mouth 3 (three) times daily before meals. 90 capsule 2   ibuprofen  (ADVIL ) 200 MG tablet Take 800-1,000 mg by mouth every 6 (six) hours as needed for headache or moderate pain.     mesalamine  (LIALDA ) 1.2 g EC tablet Take 4 tablets (4.8 g total) by mouth daily with breakfast. 120 tablet 5   methocarbamol  (ROBAXIN ) 500 MG tablet Take 1 tablet (500 mg total) by mouth 2 (two) times daily. (Patient taking differently: Take 500 mg by mouth as needed.) 20 tablet 0   ondansetron  (ZOFRAN ) 4 MG tablet Take 1 tablet (4 mg total) by mouth every 8 (eight) hours as needed for nausea or vomiting. 60 tablet 1   sertraline  (ZOLOFT ) 25 MG tablet Take 1 tablet (25 mg total) by mouth daily. (Patient not taking: Reported on 01/24/2024) 90  tablet 3   Spacer/Aero-Holding Chambers (AEROCHAMBER PLUS) inhaler Use as instructed 1 each 2   triamcinolone  cream (KENALOG ) 0.1 % Apply 1 Application topically 2 (two) times daily. Do not use more than 14 days in a row 453.6 g 0   No current facility-administered medications for this visit.   Allergies[2]   Review of Systems: All systems reviewed and negative except where noted in HPI.    No results found.  Physical Exam: There were no vitals taken for this visit. Constitutional: Pleasant,well-developed, ***male in no acute distress. HEENT: Normocephalic and atraumatic. Conjunctivae are normal. No scleral icterus. Neck supple.  Cardiovascular: Normal rate, regular rhythm.  Pulmonary/chest: Effort normal and breath sounds normal. No wheezing, rales or rhonchi. Abdominal: Soft, nondistended, nontender. Bowel sounds active throughout. There are no masses palpable. No hepatomegaly. Extremities: no edema Lymphadenopathy: No cervical adenopathy noted. Neurological: Alert and oriented to person place and time. Skin: Skin is warm and dry. No rashes noted. Psychiatric: Normal mood and affect. Behavior is normal.  CBC    Component Value Date/Time   WBC 13.3 (H) 01/24/2024 1209   RBC 4.25 01/24/2024 1209   HGB 13.4 01/24/2024 1209   HGB 15.1 02/02/2023 1425   HCT 38.9 (L) 01/24/2024 1209  HCT 43.4 02/02/2023 1425   PLT 435.0 (H) 01/24/2024 1209   PLT 333 02/02/2023 1425   MCV 91.7 01/24/2024 1209   MCV 93 02/02/2023 1425   MCH 32.3 11/12/2023 2246   MCHC 34.5 01/24/2024 1209   RDW 12.8 01/24/2024 1209   RDW 13.1 02/02/2023 1425   LYMPHSABS 1.2 01/24/2024 1209   LYMPHSABS 2.0 02/02/2023 1425   MONOABS 1.0 01/24/2024 1209   EOSABS 0.0 01/24/2024 1209   EOSABS 0.6 (H) 02/02/2023 1425   BASOSABS 0.0 01/24/2024 1209   BASOSABS 0.1 02/02/2023 1425    CMP     Component Value Date/Time   NA 137 01/24/2024 1209   NA 138 02/02/2023 1425   K 4.2 01/24/2024 1209   CL 104  01/24/2024 1209   CO2 26 01/24/2024 1209   GLUCOSE 93 01/24/2024 1209   BUN 11 01/24/2024 1209   BUN 11 02/02/2023 1425   CREATININE 0.78 01/24/2024 1209   CALCIUM 8.9 01/24/2024 1209   PROT 7.4 01/24/2024 1209   PROT 7.1 02/02/2023 1425   ALBUMIN 4.2 01/24/2024 1209   ALBUMIN 4.7 02/02/2023 1425   AST 23 01/24/2024 1209   ALT 41 01/24/2024 1209   ALKPHOS 85 01/24/2024 1209   BILITOT 0.3 01/24/2024 1209   BILITOT 0.2 02/02/2023 1425   GFRNONAA >60 11/12/2023 2246   GFRAA >60 08/29/2019 1637       Latest Ref Rng & Units 01/24/2024   12:09 PM 11/22/2023   12:08 PM 11/12/2023   10:46 PM  CBC EXTENDED  WBC 4.0 - 10.5 K/uL 13.3  10.3  11.9   RBC 4.22 - 5.81 Mil/uL 4.25  4.33  4.61   Hemoglobin 13.0 - 17.0 g/dL 86.5  86.0  85.0   HCT 39.0 - 52.0 % 38.9  40.9  43.2   Platelets 150.0 - 400.0 K/uL 435.0  362.0  336   NEUT# 1.4 - 7.7 K/uL 11.0  7.6    Lymph# 0.7 - 4.0 K/uL 1.2  1.8        ASSESSMENT AND PLAN:  Assessment and Plan    Ulcerative colitis Mild flare post-prednisone  taper with partial response to Entyvio  and prior corticosteroid improvement. Mild symptoms without severe complications. - Restarted Lialda  until symptom control. - Continued Entyvio ; third loading dose upcoming. - Provided guidance on Lialda  side effects: upper respiratory symptoms. - Advised avoidance of live vaccines on biologic therapy. - Recommended Pneumovax, Shingrix, and annual influenza vaccine. - Ordered repeat fecal calprotectin in 2 months. - Instructed to update via MyChart in 1-2 weeks on symptoms. - Discussed possible low-dose prednisone  if symptoms worsen or no improvement. - Follow-up in 3-4 months. - Instructed to submit disability paperwork for work documentation.  Recording duration: 15 minutes        Billy Knee, FNP     [1]  Social History Tobacco Use   Smoking status: Never   Smokeless tobacco: Never  Vaping Use   Vaping status: Some Days  Substance Use Topics    Alcohol use: No   Drug use: Yes    Types: Marijuana    Comment: last used 12-25-23  [2] No Known Allergies  "

## 2024-03-26 NOTE — Patient Instructions (Addendum)
 Please get Shingrix vaccine at your primary care office.  We have given you the pneumovax today.  Please have repeat labs in 2 months (around 05/24/24). You do not need an appointment. Please go directly to our lab in the basement.   Please MyChart message Dr. Stacia with an update on your symptoms in 1-2 weeks.  _______________________________________________________  If your blood pressure at your visit was 140/90 or greater, please contact your primary care physician to follow up on this.  _______________________________________________________  If you are age 60 or older, your body mass index should be between 23-30. Your Body mass index is 29.02 kg/m. If this is out of the aforementioned range listed, please consider follow up with your Primary Care Provider.  If you are age 40 or younger, your body mass index should be between 19-25. Your Body mass index is 29.02 kg/m. If this is out of the aformentioned range listed, please consider follow up with your Primary Care Provider.   ________________________________________________________  The [facility] providers would like to encourage you to use MYCHART to communicate with providers for non-urgent requests or questions.  Due to long hold times on the telephone, sending your provider a message by [facility] may be a faster and more efficient way to get a response.  Please allow 48 business hours for a response.  Please remember that this is for non-urgent requests.  _______________________________________________________  [facility] is using a team-based approach to care.  Your team is made up of your doctor and two to three APPS. Our APPS (Nurse Practitioners and Physician Assistants) work with your physician to ensure care continuity for you. They are fully qualified to address your health concerns and develop a treatment plan. They communicate directly with your gastroenterologist to care for you. Seeing the [facility] Practitioners on your physician's team can help you by facilitating care more promptly, often allowing for earlier appointments, access to diagnostic testing, procedures, and other specialty referrals.

## 2024-03-27 ENCOUNTER — Ambulatory Visit

## 2024-03-27 VITALS — BP 118/83 | HR 79 | Temp 98.3°F | Resp 12 | Ht 74.0 in | Wt 225.4 lb

## 2024-03-27 DIAGNOSIS — K51011 Ulcerative (chronic) pancolitis with rectal bleeding: Secondary | ICD-10-CM

## 2024-03-27 MED ORDER — SODIUM CHLORIDE 0.9 % IV SOLN: INTRAVENOUS | Status: DC

## 2024-03-27 MED ORDER — VEDOLIZUMAB 300 MG IV SOLR
300.0000 mg | Freq: Once | INTRAVENOUS | Status: AC
  Administered 2024-03-27: 300 mg via INTRAVENOUS
  Filled 2024-03-27: qty 5

## 2024-03-27 NOTE — Progress Notes (Signed)
 Diagnosis: , Ulcerative Colitis  Provider:  Praveen Mannam MD  Procedure: IV Infusion  IV Type: Peripheral, IV Location: R Hand  , Entyvio  (Vedolizumab ), Dose: 300 mg  Infusion Start Time: 1011  Infusion Stop Time: 1045  Post Infusion IV Care: Peripheral IV Discontinued  Discharge: Condition: Good, Destination: Home . AVS Declined  Performed by:  Donny Childes, RN

## 2024-03-27 NOTE — Patient Instructions (Addendum)
 SABRA

## 2024-03-30 ENCOUNTER — Encounter: Payer: Self-pay | Admitting: Gastroenterology

## 2024-03-30 ENCOUNTER — Telehealth: Payer: Self-pay | Admitting: Gastroenterology

## 2024-03-30 NOTE — Telephone Encounter (Signed)
 Attempted to call patient however unable to leave a message as voicemail was full. Letter sent to patient's MyChart by Dr Stacia.

## 2024-03-30 NOTE — Telephone Encounter (Signed)
 Inbound call from patient stating that he is needing a letter for work stating that he is clear to go back to work. Patient stated that he was on a short term disability and in order for him to go back they are requiring a letter from his doctor. Patient stated that he can pick it the letter either today or tomorrow. Patient is requesting a call back. Please advise.

## 2024-04-01 ENCOUNTER — Telehealth: Payer: Self-pay | Admitting: Gastroenterology

## 2024-04-01 NOTE — Telephone Encounter (Signed)
 Patient called wishing to discuss prednisone  medication. States he is still having symptoms. Please advise, thank you

## 2024-04-02 NOTE — Telephone Encounter (Signed)
 Attempted to call pt, no answer and unable to leave a message.

## 2024-04-03 ENCOUNTER — Other Ambulatory Visit: Payer: Self-pay | Admitting: Internal Medicine

## 2024-04-03 DIAGNOSIS — J4521 Mild intermittent asthma with (acute) exacerbation: Secondary | ICD-10-CM

## 2024-04-03 MED ORDER — PREDNISONE 5 MG PO TABS: ORAL_TABLET | ORAL | 0 refills | Status: AC

## 2024-04-03 NOTE — Telephone Encounter (Signed)
 Copied from CRM #[id number]. Topic: Clinical - Medication Refill >> Apr 03, 2024  2:55 PM Shereese L wrote: Medication: albuterol  (VENTOLIN  HFA) 108 (90 Base) MCG/ACT inhaler  Has the patient contacted their pharmacy? Yes (Agent: If no, request that the patient contact the pharmacy for the refill. If patient does not wish to contact the pharmacy document the reason why and proceed with request.) (Agent: If yes, when and what did the pharmacy advise?)  This is the patient's preferred pharmacy:  [facility] [phone] - [place], [place] - [address] AT [facility] [address] [place] [address] Phone: [phone] Fax: [phone]  Is this the correct pharmacy for this prescription? Yes If no, delete pharmacy and type the correct one.   Has the prescription been filled recently? Yes  Is the patient out of the medication? Yes  Has the patient been seen for an appointment in the last year OR does the patient have an upcoming appointment? Yes  Can we respond through MyChart? Yes  Agent: Please be advised that Rx refills may take up to 3 business days. We ask that you follow-up with your pharmacy.

## 2024-04-03 NOTE — Telephone Encounter (Signed)
 I have spoken to patient to advise of recommendation as per Dr Stacia. Patient is asked to call our office or mychart message us  with an update of his symptoms after 1 week of being on prednisone  taper. He verbalizes understanding.

## 2024-04-03 NOTE — Telephone Encounter (Signed)
 I have spoken to patient who states that over the last week, he has had an increase in diarrhea/loose stools, having 3-4 bm daily along with blood in stool (states this happens with about 30% of bowel movements). Patient states that he has also been having some periumbilical abdominal pain intermittently but worse before bowel movements. Denies fevers but has been having night sweats. Just finished his last round of induction Entyvio  last week.  Patient states that he recently discussed the possibility of low dose prednisone  until Entyvio  takes effect with Dr Stacia.  Please advise.SABRASABRA

## 2024-04-03 NOTE — Telephone Encounter (Signed)
 Inbound call from patient requesting to speak to [facility]. Patient is requesting a call back. Please advise.

## 2024-04-03 NOTE — Telephone Encounter (Signed)
Attempted to reach patient. No answer, voicemail full.

## 2024-04-10 ENCOUNTER — Telehealth: Payer: Self-pay | Admitting: Gastroenterology

## 2024-04-10 NOTE — Telephone Encounter (Signed)
 The following patient is requesting that we send his OV notes from 1/15 sent to [facility] for his disability. Please send to  [phone]. He would also like to discuss the rectal bleeding he has been experiencing the past few days. He is very concerned. Please advise.

## 2024-04-13 NOTE — Telephone Encounter (Signed)
 Discussed patient's most recent complaints of rectal bleeding from 04/10/24. Patient tells me today that he continues to have rectal bleeding and pretty frequent lower abdominal pain. However, he says he has been on Prednisone  20 mg x 5-6 days (was given taper 04/03/24) and his symptoms seem to be trickling down a little at a time. I advised that if his symptoms begin to worsen again or or persist despite continuation of the prednisone  taper that he make us  aware. Otherwise, monitor for now. He verbalizes understanding.

## 2024-04-14 ENCOUNTER — Encounter: Payer: Self-pay | Admitting: Internal Medicine

## 2024-04-14 ENCOUNTER — Ambulatory Visit: Admitting: Internal Medicine

## 2024-04-14 VITALS — BP 118/82 | HR 95 | Temp 97.8°F | Ht 74.0 in | Wt 227.2 lb

## 2024-04-14 DIAGNOSIS — Z0000 Encounter for general adult medical examination without abnormal findings: Secondary | ICD-10-CM

## 2024-04-14 DIAGNOSIS — Z13220 Encounter for screening for lipoid disorders: Secondary | ICD-10-CM

## 2024-04-14 DIAGNOSIS — Z23 Encounter for immunization: Secondary | ICD-10-CM

## 2024-04-15 ENCOUNTER — Ambulatory Visit: Payer: Self-pay | Admitting: Internal Medicine

## 2024-04-15 ENCOUNTER — Telehealth: Payer: Self-pay | Admitting: Physician Assistant

## 2024-04-15 LAB — LIPID PANEL
Cholesterol: 188 mg/dL (ref 28–200)
HDL: 64.4 mg/dL
LDL Cholesterol: 101 mg/dL — ABNORMAL HIGH (ref 10–99)
NonHDL: 123.93
Total CHOL/HDL Ratio: 3
Triglycerides: 115 mg/dL (ref 10.0–149.0)
VLDL: 23 mg/dL (ref 0.0–40.0)

## 2024-04-15 LAB — CBC WITH DIFFERENTIAL/PLATELET
Basophils Absolute: 0.1 10*3/uL (ref 0.0–0.1)
Basophils Relative: 0.9 % (ref 0.0–3.0)
Eosinophils Absolute: 0.1 10*3/uL (ref 0.0–0.7)
Eosinophils Relative: 1.1 % (ref 0.0–5.0)
HCT: 43.3 % (ref 39.0–52.0)
Hemoglobin: 14.6 g/dL (ref 13.0–17.0)
Lymphocytes Relative: 23.3 % (ref 12.0–46.0)
Lymphs Abs: 2.6 10*3/uL (ref 0.7–4.0)
MCHC: 33.7 g/dL (ref 30.0–36.0)
MCV: 89.5 fl (ref 78.0–100.0)
Monocytes Absolute: 0.9 10*3/uL (ref 0.1–1.0)
Monocytes Relative: 7.9 % (ref 3.0–12.0)
Neutro Abs: 7.5 10*3/uL (ref 1.4–7.7)
Neutrophils Relative %: 66.8 % (ref 43.0–77.0)
Platelets: 394 10*3/uL (ref 150.0–400.0)
RBC: 4.84 Mil/uL (ref 4.22–5.81)
RDW: 13.2 % (ref 11.5–15.5)
WBC: 11.3 10*3/uL — ABNORMAL HIGH (ref 4.0–10.5)

## 2024-04-15 LAB — COMPREHENSIVE METABOLIC PANEL WITH GFR
ALT: 35 U/L (ref 3–53)
AST: 21 U/L (ref 5–37)
Albumin: 4.3 g/dL (ref 3.5–5.2)
Alkaline Phosphatase: 73 U/L (ref 39–117)
BUN: 11 mg/dL (ref 6–23)
CO2: 25 meq/L (ref 19–32)
Calcium: 9.4 mg/dL (ref 8.4–10.5)
Chloride: 103 meq/L (ref 96–112)
Creatinine, Ser: 0.86 mg/dL (ref 0.40–1.50)
GFR: 119.33 mL/min
Glucose, Bld: 82 mg/dL (ref 70–99)
Potassium: 3.7 meq/L (ref 3.5–5.1)
Sodium: 137 meq/L (ref 135–145)
Total Bilirubin: 0.2 mg/dL (ref 0.2–1.2)
Total Protein: 7.4 g/dL (ref 6.0–8.3)

## 2024-04-15 LAB — TSH: TSH: 2.57 u[IU]/mL (ref 0.35–5.50)

## 2024-04-15 NOTE — Telephone Encounter (Signed)
 Out of work extension.  Follow-up ulcerative colitis symptoms.

## 2024-04-15 NOTE — Progress Notes (Signed)
 Hi Gregory Clarke,  Labs look good. White blood cell count is elevated due to the prednisone  you are taking. All other lab work is within normal range.   [name]

## 2024-04-16 NOTE — Telephone Encounter (Signed)
 I spoke with Gregory Clarke and he will try and come by tomorrow and sign the form so we can fax it to [place].

## 2024-05-22 ENCOUNTER — Ambulatory Visit

## 2025-04-15 ENCOUNTER — Encounter: Admitting: Internal Medicine
